# Patient Record
Sex: Male | Born: 1965
Health system: Southern US, Community
[De-identification: ages and names within clinical notes are randomized; demographics above are authoritative.]

## PROBLEM LIST (undated history)

## (undated) DIAGNOSIS — E785 Hyperlipidemia, unspecified: Secondary | ICD-10-CM

## (undated) DIAGNOSIS — I10 Essential (primary) hypertension: Secondary | ICD-10-CM

## (undated) DIAGNOSIS — E119 Type 2 diabetes mellitus without complications: Secondary | ICD-10-CM

## (undated) DIAGNOSIS — R011 Cardiac murmur, unspecified: Secondary | ICD-10-CM

## (undated) HISTORY — DX: Type 2 diabetes mellitus without complications: E11.9

## (undated) HISTORY — DX: Hyperlipidemia, unspecified: E78.5

## (undated) HISTORY — DX: Essential (primary) hypertension: I10

## (undated) HISTORY — DX: Cardiac murmur, unspecified: R01.1

---

## 1991-03-16 HISTORY — PX: PELVIC FRACTURE SURGERY: SHX119

## 2018-12-01 DIAGNOSIS — Z1159 Encounter for screening for other viral diseases: Secondary | ICD-10-CM | POA: Diagnosis not present

## 2019-09-25 DIAGNOSIS — K115 Sialolithiasis: Secondary | ICD-10-CM | POA: Diagnosis not present

## 2019-09-27 DIAGNOSIS — K0889 Other specified disorders of teeth and supporting structures: Secondary | ICD-10-CM | POA: Diagnosis not present

## 2019-09-27 DIAGNOSIS — K115 Sialolithiasis: Secondary | ICD-10-CM | POA: Diagnosis not present

## 2019-10-29 ENCOUNTER — Ambulatory Visit: Payer: Federal, State, Local not specified - PPO | Admitting: Physician Assistant

## 2019-11-12 DIAGNOSIS — K080 Exfoliation of teeth due to systemic causes: Secondary | ICD-10-CM | POA: Diagnosis not present

## 2019-11-12 HISTORY — PX: DENTAL SURGERY: SHX609

## 2019-11-28 ENCOUNTER — Other Ambulatory Visit: Payer: Self-pay

## 2019-11-28 ENCOUNTER — Ambulatory Visit (INDEPENDENT_AMBULATORY_CARE_PROVIDER_SITE_OTHER): Payer: Federal, State, Local not specified - PPO | Admitting: Physician Assistant

## 2019-11-28 ENCOUNTER — Encounter: Payer: Self-pay | Admitting: Physician Assistant

## 2019-11-28 VITALS — BP 120/80 | HR 84 | Temp 98.0°F | Ht 68.0 in | Wt 221.0 lb

## 2019-11-28 DIAGNOSIS — E119 Type 2 diabetes mellitus without complications: Secondary | ICD-10-CM | POA: Insufficient documentation

## 2019-11-28 DIAGNOSIS — G4739 Other sleep apnea: Secondary | ICD-10-CM | POA: Diagnosis not present

## 2019-11-28 DIAGNOSIS — E785 Hyperlipidemia, unspecified: Secondary | ICD-10-CM

## 2019-11-28 DIAGNOSIS — B351 Tinea unguium: Secondary | ICD-10-CM | POA: Insufficient documentation

## 2019-11-28 DIAGNOSIS — E1169 Type 2 diabetes mellitus with other specified complication: Secondary | ICD-10-CM

## 2019-11-28 DIAGNOSIS — N529 Male erectile dysfunction, unspecified: Secondary | ICD-10-CM | POA: Insufficient documentation

## 2019-11-28 DIAGNOSIS — I10 Essential (primary) hypertension: Secondary | ICD-10-CM

## 2019-11-28 DIAGNOSIS — E7849 Other hyperlipidemia: Secondary | ICD-10-CM | POA: Diagnosis not present

## 2019-11-28 MED ORDER — TADALAFIL 20 MG PO TABS: 10.0000 mg | ORAL_TABLET | ORAL | 11 refills | Status: AC | PRN

## 2019-11-28 NOTE — Assessment & Plan Note (Signed)
Has never been on medications

## 2019-11-28 NOTE — Progress Notes (Signed)
Harold Grimes is a 54 y.o. male is here to discuss:  I acted as a Neurosurgeon for [employer], PA-C Corky Mull, LPN   History of Present Illness:   Chief Complaint  Patient presents with  . Establish Care  . Diabetes  . Nail Problem  . Erectile Dysfunction    HPI   Diabetes Diagnosed in 2019. Last HgbA1c 7.2, highest was 7.7. Pt is checking sugars BID, averaging 176, this morning was 161. Currently taking Metformin 500 mg BID. Complaint with medication. Denies signs of hypoglycemia. Has never been on medication.  HTN Currently not on any medication. At home blood pressure readings are: not checked. Patient denies chest pain, SOB, blurred vision, dizziness, unusual headaches, lower leg swelling.  Denies excessive caffeine intake, stimulant usage, excessive alcohol intake, or increase in salt consumption.  BP Readings from Last 3 Encounters:  11/28/19 120/80   Sleep-apnea like behavior Has concerns that he may have sleep apnea. Was supposed to have sleep study in the past but did not due to life responsibilities. Wife is present and states that he has significant snoring and pauses in breathing.  Toenail fungus Pt would like to discuss treatment for fungus.  Has never tried anything in the past. Has significantly thickened toenails that are difficult to cut.  Erectile dysfunction Pt c/o problems x 1 year. Has never tried prescription medications. States that things are "just not working."   Health Maintenance Due  Topic Date Due  . HEMOGLOBIN A1C  Never done  . Hepatitis C Screening  Never done  . OPHTHALMOLOGY EXAM  Never done  . URINE MICROALBUMIN  Never done  . HIV Screening  Never done  . COLONOSCOPY  Never done  . INFLUENZA VACCINE  10/14/2019    Past Medical History:  Diagnosis Date  . Diabetes mellitus without complication (HCC)   . Heart murmur   . Hyperlipidemia   . Hypertension      Social History   Tobacco Use  . Smoking status: Former  Smoker    Types: Cigarettes  . Smokeless tobacco: Never Used  . Tobacco comment: quit 2007  Vaping Use  . Vaping Use: Never used  Substance Use Topics  . Alcohol use: Yes    Alcohol/week: 2.0 - 3.0 standard drinks    Types: 2 - 3 Cans of beer per week  . Drug use: Never    Past Surgical History:  Procedure Laterality Date  . DENTAL SURGERY  11/12/2019   Laser sx on gums  . PELVIC FRACTURE SURGERY  1993    History reviewed. No pertinent family history.  PMHx, SurgHx, SocialHx, FamHx, Medications, and Allergies were reviewed in the Visit Navigator and updated as appropriate.   Patient Active Problem List   Diagnosis Date Noted  . Diabetes mellitus (HCC) 11/28/2019  . Essential hypertension 11/28/2019  . Hyperlipidemia associated with type 2 diabetes mellitus (HCC) 11/28/2019  . Sleep apnea-like behavior 11/28/2019  . Toenail fungus 11/28/2019  . Erectile dysfunction 11/28/2019    Social History   Tobacco Use  . Smoking status: Former Smoker    Types: Cigarettes  . Smokeless tobacco: Never Used  . Tobacco comment: quit 2007  Vaping Use  . Vaping Use: Never used  Substance Use Topics  . Alcohol use: Yes    Alcohol/week: 2.0 - 3.0 standard drinks    Types: 2 - 3 Cans of beer per week  . Drug use: Never    Current Medications and Allergies:  Current Outpatient Medications:  .  aspirin 81 MG chewable tablet, Chew by mouth., Disp: , Rfl:  .  atorvastatin (LIPITOR) 40 MG tablet, Take 40 mg by mouth at bedtime., Disp: , Rfl:  .  ibuprofen (ADVIL) 200 MG tablet, Take by mouth., Disp: , Rfl:  .  metFORMIN (GLUCOPHAGE) 500 MG tablet, Take 500 mg by mouth 2 (two) times daily., Disp: , Rfl:  .  tadalafil (CIALIS) 20 MG tablet, Take 0.5-1 tablets (10-20 mg total) by mouth every other day as needed for erectile dysfunction., Disp: 5 tablet, Rfl: 11  No Known Allergies  Review of Systems   ROS  Negative unless otherwise specified per HPI.  Vitals:   Vitals:    11/28/19 0957  BP: 120/80  Pulse: 84  Temp: 98 F (36.7 C)  TempSrc: Temporal  SpO2: 95%  Weight: 221 lb (100.2 kg)  Height: 5\' 8"  (1.727 m)     Body mass index is 33.6 kg/m.   Physical Exam:    Physical Exam Vitals and nursing note reviewed.  Constitutional:      General: He is not in acute distress.    Appearance: He is well-developed. He is not ill-appearing or toxic-appearing.  Cardiovascular:     Rate and Rhythm: Normal rate and regular rhythm.     Pulses: Normal pulses.     Heart sounds: Normal heart sounds, S1 normal and S2 normal.     Comments: No LE edema Pulmonary:     Effort: Pulmonary effort is normal.     Breath sounds: Normal breath sounds.  Feet:     Comments: Significant yellow and thickened toenails on bilateral feet Skin:    General: Skin is warm and dry.  Neurological:     Mental Status: He is alert.     GCS: GCS eye subscore is 4. GCS verbal subscore is 5. GCS motor subscore is 6.  Psychiatric:        Speech: Speech normal.        Behavior: Behavior normal. Behavior is cooperative.    Diabetic Foot Exam - Simple   Simple Foot Form Diabetic Foot exam was performed with the following findings: Yes 11/28/2019 11:07 AM  Visual Inspection No deformities, no ulcerations, no other skin breakdown bilaterally: Yes Sensation Testing Intact to touch and monofilament testing bilaterally: Yes Pulse Check Posterior Tibialis and Dorsalis pulse intact bilaterally: Yes Comments       Assessment and Plan:    Shloime was seen today for establish care, diabetes, nail problem and erectile dysfunction.  Diagnoses and all orders for this visit:  Type 2 diabetes mellitus without complication, without long-term current use of insulin (HCC) Update HgbA1c today. Possible increase Metformin dosage, however I did discuss GLP-1, would like to trial Ozempic if patient is agreeable. Foot exam completed today. Eye exam needed - referral placed. Follow-up in 3  months. -     CBC with Differential/Platelet; Future -     Comprehensive metabolic panel; Future -     Hemoglobin A1c; Future -     Cancel: Microalbumin / creatinine urine ratio; Future -     Microalbumin / creatinine urine ratio; Future -     Microalbumin / creatinine urine ratio -     Hemoglobin A1c -     Comprehensive metabolic panel -     CBC with Differential/Platelet -     Ambulatory referral to Ophthalmology  Essential hypertension Currently well controlled. Recommend sleep study evaluation. Follow-up in 3 months.  Erectile  dysfunction, unspecified erectile dysfunction type Trial Cialis. Follow-up as needed.  Toenail fungus Consider Terbinafine if LFTs allowed, otherwise will refer to podiatry.  Sleep apnea-like behavior Referral to sleep studies. -     Ambulatory referral to Sleep Studies  Hyperlipidemia associated with type 2 diabetes mellitus (HCC) Update lipid panel and reassess ASCVD. Will adjust lipitor as needed. -     Lipid panel; Future -     Lipid panel  Other orders -     tadalafil (CIALIS) 20 MG tablet; Take 0.5-1 tablets (10-20 mg total) by mouth every other day as needed for erectile dysfunction.  . Reviewed expectations re: course of current medical issues. . Discussed self-management of symptoms. . Outlined signs and symptoms indicating need for more acute intervention. . Patient verbalized understanding and all questions were answered. . See orders for this visit as documented in the electronic medical record. . Patient received an After Visit Summary.  Time spent with patient today was 45 minutes which consisted of chart review, discussing diagnosis, work up, treatment answering questions and documentation.  CMA or LPN served as scribe during this visit. History, Physical, and Plan performed by medical provider. The above documentation has been reviewed and is accurate and complete.   Jarold Motto, PA-C [place] 11/28/2019  Follow-up: No follow-ups on file.

## 2019-11-28 NOTE — Patient Instructions (Signed)
It was great to see you!  Diabetes: Let's update your HgbA1c today Consider weekly Ozempic injections! We did your foot exam today You will be contacted about appointment for eye doctor I will be in touch with the recommendations  Toenail fungus: Let's check your liver labs; may start oral medication if your liver tests allow Or we will send to podiatry  ED Cialis rx has been printed Check Good[facility] to see where its the cheapest  Sleep apnea You will be contacted about a referral for this  Follow-up with me in 3 months, sooner if concerns.  Take care,  Jarold Motto PA-C

## 2019-11-29 ENCOUNTER — Encounter: Payer: Self-pay | Admitting: Physician Assistant

## 2019-11-29 LAB — LIPID PANEL
Cholesterol: 143 mg/dL (ref <200)
HDL: 47 mg/dL (ref >=40)
LDL Cholesterol (Calc): 71 mg/dL (calc)
Non-HDL Cholesterol (Calc): 96 mg/dL (calc) (ref <130)
Total CHOL/HDL Ratio: 3 (calc) (ref <5.0)
Triglycerides: 176 mg/dL — ABNORMAL HIGH (ref <150)

## 2019-11-29 LAB — COMPREHENSIVE METABOLIC PANEL
AG Ratio: 1.7 (calc) (ref 1.0–2.5)
ALT: 37 U/L (ref 9–46)
AST: 22 U/L (ref 10–35)
Albumin: 4.6 g/dL (ref 3.6–5.1)
Alkaline phosphatase (APISO): 76 U/L (ref 35–144)
BUN: 14 mg/dL (ref 7–25)
CO2: 29 mmol/L (ref 20–32)
Calcium: 9.7 mg/dL (ref 8.6–10.3)
Chloride: 102 mmol/L (ref 98–110)
Creat: 0.96 mg/dL (ref 0.70–1.33)
Globulin: 2.7 g/dL (calc) (ref 1.9–3.7)
Glucose, Bld: 119 mg/dL — ABNORMAL HIGH (ref 65–99)
Potassium: 4.9 mmol/L (ref 3.5–5.3)
Sodium: 139 mmol/L (ref 135–146)
Total Bilirubin: 0.6 mg/dL (ref 0.2–1.2)
Total Protein: 7.3 g/dL (ref 6.1–8.1)

## 2019-11-29 LAB — CBC WITH DIFFERENTIAL/PLATELET
Absolute Monocytes: 599 cells/uL (ref 200–950)
Basophils Absolute: 80 cells/uL (ref 0–200)
Basophils Relative: 1.1 %
Eosinophils Absolute: 102 cells/uL (ref 15–500)
Eosinophils Relative: 1.4 %
HCT: 46.2 % (ref 38.5–50.0)
Hemoglobin: 15.4 g/dL (ref 13.2–17.1)
Lymphs Abs: 2117 cells/uL (ref 850–3900)
MCH: 29.1 pg (ref 27.0–33.0)
MCHC: 33.3 g/dL (ref 32.0–36.0)
MCV: 87.3 fL (ref 80.0–100.0)
MPV: 10.2 fL (ref 7.5–12.5)
Monocytes Relative: 8.2 %
Neutro Abs: 4402 cells/uL (ref 1500–7800)
Neutrophils Relative %: 60.3 %
Platelets: 328 10*3/uL (ref 140–400)
RBC: 5.29 10*6/uL (ref 4.20–5.80)
RDW: 12.6 % (ref 11.0–15.0)
Total Lymphocyte: 29 %
WBC: 7.3 10*3/uL (ref 3.8–10.8)

## 2019-11-29 LAB — MICROALBUMIN / CREATININE URINE RATIO
Creatinine, Urine: 97 mg/dL (ref 20–320)
Microalb Creat Ratio: 7 mcg/mg creat (ref <30)
Microalb, Ur: 0.7 mg/dL

## 2019-11-29 LAB — HEMOGLOBIN A1C
Hgb A1c MFr Bld: 6.7 % of total Hgb — ABNORMAL HIGH (ref <5.7)
Mean Plasma Glucose: 146 (calc)
eAG (mmol/L): 8.1 (calc)

## 2019-11-30 NOTE — Telephone Encounter (Signed)
Spoke to told him per Lelon Mast, we got his message and tell him he can pick up an ozempic sample and Start weekly 0.25 mg injections. Pt verbalized understanding and will come by and pickup sample. Also Follow-up with me in 1 month to see how things are going, sooner if concerns. Pt verbalized understanding.

## 2019-12-10 ENCOUNTER — Telehealth: Payer: Self-pay | Admitting: Neurology

## 2019-12-10 NOTE — Telephone Encounter (Signed)
I called pt to reschedule sleep consult. No answer, left message for pt to call me back to reschedule sleep consult with Dr. Athar.  °

## 2019-12-18 ENCOUNTER — Encounter: Payer: Self-pay | Admitting: Physician Assistant

## 2019-12-21 ENCOUNTER — Other Ambulatory Visit: Payer: Self-pay | Admitting: Physician Assistant

## 2019-12-21 MED ORDER — TERBINAFINE HCL 250 MG PO TABS: 250.0000 mg | ORAL_TABLET | Freq: Every day | ORAL | 0 refills | Status: AC

## 2019-12-24 DIAGNOSIS — K080 Exfoliation of teeth due to systemic causes: Secondary | ICD-10-CM | POA: Diagnosis not present

## 2020-01-03 DIAGNOSIS — H524 Presbyopia: Secondary | ICD-10-CM | POA: Diagnosis not present

## 2020-01-03 DIAGNOSIS — H25043 Posterior subcapsular polar age-related cataract, bilateral: Secondary | ICD-10-CM | POA: Diagnosis not present

## 2020-01-03 DIAGNOSIS — H40013 Open angle with borderline findings, low risk, bilateral: Secondary | ICD-10-CM | POA: Diagnosis not present

## 2020-01-03 DIAGNOSIS — H5213 Myopia, bilateral: Secondary | ICD-10-CM | POA: Diagnosis not present

## 2020-01-03 DIAGNOSIS — H52223 Regular astigmatism, bilateral: Secondary | ICD-10-CM | POA: Diagnosis not present

## 2020-01-03 DIAGNOSIS — E119 Type 2 diabetes mellitus without complications: Secondary | ICD-10-CM | POA: Diagnosis not present

## 2020-01-03 DIAGNOSIS — H0102A Squamous blepharitis right eye, upper and lower eyelids: Secondary | ICD-10-CM | POA: Diagnosis not present

## 2020-01-03 LAB — HM DIABETES EYE EXAM

## 2020-01-04 ENCOUNTER — Encounter: Payer: Self-pay | Admitting: Physician Assistant

## 2020-01-04 ENCOUNTER — Other Ambulatory Visit: Payer: Self-pay

## 2020-01-04 ENCOUNTER — Ambulatory Visit: Payer: Federal, State, Local not specified - PPO | Admitting: Physician Assistant

## 2020-01-04 VITALS — BP 120/80 | HR 76 | Temp 98.3°F | Ht 68.0 in | Wt 218.0 lb

## 2020-01-04 DIAGNOSIS — E119 Type 2 diabetes mellitus without complications: Secondary | ICD-10-CM | POA: Diagnosis not present

## 2020-01-04 DIAGNOSIS — Z23 Encounter for immunization: Secondary | ICD-10-CM | POA: Diagnosis not present

## 2020-01-04 MED ORDER — OZEMPIC (0.25 OR 0.5 MG/DOSE) 2 MG/1.5ML ~~LOC~~ SOPN: 0.5000 mg | PEN_INJECTOR | SUBCUTANEOUS | 3 refills | Status: DC

## 2020-01-04 NOTE — Patient Instructions (Signed)
It was great to see you!  I'm glad you are doing well!  Let's have you follow-up around Christmas/New Years to update your HgbA1c, sooner if concerns.  Take care,  Jarold Motto PA-C

## 2020-01-04 NOTE — Progress Notes (Signed)
Harold Grimes is a 54 y.o. male is here for follow up.  I acted as a Neurosurgeon for [employer], PA-C Harold Mull, LPN   History of Present Illness:   Chief Complaint  Patient presents with  . Medication Management    Ozempic    HPI   Medication management Pt is here today to follow up on Ozempic was started last month. He discontinued his metformin. Pt said he is tolerating Ozempic 0.25 mg weekly, no side effects. Denies nausea or diarrhea. He is down 3 lbs since last visit 9/15. He had anxiety about self-injections but is doing quite well. Had his eye exam yesterday.  Wt Readings from Last 5 Encounters:  01/04/20 218 lb (98.9 kg)  11/28/19 221 lb (100.2 kg)     Health Maintenance Due  Topic Date Due  . Hepatitis C Screening  Never done  . OPHTHALMOLOGY EXAM  Never done  . HIV Screening  Never done  . COLONOSCOPY  Never done  . INFLUENZA VACCINE  10/14/2019    Past Medical History:  Diagnosis Date  . Diabetes mellitus without complication (HCC)   . Heart murmur   . Hyperlipidemia   . Hypertension      Social History   Tobacco Use  . Smoking status: Former Smoker    Types: Cigarettes  . Smokeless tobacco: Never Used  . Tobacco comment: quit 2007  Vaping Use  . Vaping Use: Never used  Substance Use Topics  . Alcohol use: Yes    Alcohol/week: 2.0 - 3.0 standard drinks    Types: 2 - 3 Cans of beer per week  . Drug use: Never    Past Surgical History:  Procedure Laterality Date  . DENTAL SURGERY  11/12/2019   Laser sx on gums  . PELVIC FRACTURE SURGERY  1993    Family History  Problem Relation Age of Onset  . Alcohol abuse Father   . Cancer Father   . Heart attack Father   . Drug abuse Sister   . Hearing loss Brother   . Heart disease Brother   . Cancer Paternal Grandmother   . Cancer Paternal Grandfather   . Heart attack Paternal Grandfather   . Drug abuse Brother     PMHx, SurgHx, SocialHx, FamHx, Medications, and Allergies were  reviewed in the Visit Navigator and updated as appropriate.   Patient Active Problem List   Diagnosis Date Noted  . Diabetes mellitus (HCC) 11/28/2019  . Essential hypertension 11/28/2019  . Hyperlipidemia associated with type 2 diabetes mellitus (HCC) 11/28/2019  . Sleep apnea-like behavior 11/28/2019  . Toenail fungus 11/28/2019  . Erectile dysfunction 11/28/2019    Social History   Tobacco Use  . Smoking status: Former Smoker    Types: Cigarettes  . Smokeless tobacco: Never Used  . Tobacco comment: quit 2007  Vaping Use  . Vaping Use: Never used  Substance Use Topics  . Alcohol use: Yes    Alcohol/week: 2.0 - 3.0 standard drinks    Types: 2 - 3 Cans of beer per week  . Drug use: Never    Current Medications and Allergies:    Current Outpatient Medications:  .  aspirin 81 MG chewable tablet, Chew by mouth., Disp: , Rfl:  .  atorvastatin (LIPITOR) 40 MG tablet, Take 40 mg by mouth at bedtime., Disp: , Rfl:  .  ibuprofen (ADVIL) 200 MG tablet, Take by mouth., Disp: , Rfl:  .  tadalafil (CIALIS) 20 MG tablet, Take 0.5-1 tablets (  10-20 mg total) by mouth every other day as needed for erectile dysfunction., Disp: 5 tablet, Rfl: 11 .  terbinafine (LAMISIL) 250 MG tablet, Take 1 tablet (250 mg total) by mouth daily., Disp: 42 tablet, Rfl: 0 .  Semaglutide,0.25 or 0.5MG /DOS, (OZEMPIC, 0.25 OR 0.5 MG/DOSE,) 2 MG/1.5ML SOPN, Inject 0.5 mg into the skin once a week., Disp: 1.5 mL, Rfl: 3  No Known Allergies  Review of Systems   ROS Negative unless otherwise specified per HPI.  Vitals:   Vitals:   01/04/20 1048  BP: 120/80  Pulse: 76  Temp: 98.3 F (36.8 C)  TempSrc: Temporal  SpO2: 94%  Weight: 218 lb (98.9 kg)  Height: 5\' 8"  (1.727 m)     Body mass index is 33.15 kg/m.   Physical Exam:    Physical Exam Vitals and nursing note reviewed.  Constitutional:      Appearance: He is well-developed.  HENT:     Head: Normocephalic.  Eyes:     Conjunctiva/sclera:  Conjunctivae normal.     Pupils: Pupils are equal, round, and reactive to light.  Pulmonary:     Effort: Pulmonary effort is normal.  Musculoskeletal:        General: Normal range of motion.     Cervical back: Normal range of motion.  Skin:    General: Skin is warm and dry.  Neurological:     Mental Status: He is alert and oriented to person, place, and time.  Psychiatric:        Behavior: Behavior normal.        Thought Content: Thought content normal.        Judgment: Judgment normal.      Assessment and Plan:    Harold Grimes was seen today for medication management.  Diagnoses and all orders for this visit:  Type 2 diabetes mellitus without complication, without long-term current use of insulin (HCC)  Other orders -     Semaglutide,0.25 or 0.5MG /DOS, (OZEMPIC, 0.25 OR 0.5 MG/DOSE,) 2 MG/1.5ML SOPN; Inject 0.5 mg into the skin once a week.    Doing quite well with Ozempic 0.25 mg weekly. Will increase to Ozempic 0.5 mg weekly. Savings card provided. Follow-up around Christmas/New Years, sooner if concerns.  CMA or LPN served as scribe during this visit. History, Physical, and Plan performed by medical provider. The above documentation has been reviewed and is accurate and complete.  Harold Maduro, PA-C , [facility] 01/04/2020  Follow-up: No follow-ups on file.

## 2020-01-06 ENCOUNTER — Encounter: Payer: Self-pay | Admitting: Physician Assistant

## 2020-01-07 ENCOUNTER — Institutional Professional Consult (permissible substitution): Payer: Self-pay | Admitting: Neurology

## 2020-01-10 ENCOUNTER — Ambulatory Visit: Payer: Federal, State, Local not specified - PPO | Admitting: Neurology

## 2020-01-10 ENCOUNTER — Encounter: Payer: Self-pay | Admitting: Neurology

## 2020-01-10 VITALS — BP 126/84 | HR 85 | Ht 69.0 in | Wt 212.0 lb

## 2020-01-10 DIAGNOSIS — F410 Panic disorder [episodic paroxysmal anxiety] without agoraphobia: Secondary | ICD-10-CM

## 2020-01-10 DIAGNOSIS — G4719 Other hypersomnia: Secondary | ICD-10-CM

## 2020-01-10 DIAGNOSIS — G4730 Sleep apnea, unspecified: Secondary | ICD-10-CM

## 2020-01-10 DIAGNOSIS — F4310 Post-traumatic stress disorder, unspecified: Secondary | ICD-10-CM

## 2020-01-10 DIAGNOSIS — R29818 Other symptoms and signs involving the nervous system: Secondary | ICD-10-CM | POA: Insufficient documentation

## 2020-01-10 DIAGNOSIS — G4709 Other insomnia: Secondary | ICD-10-CM

## 2020-01-10 DIAGNOSIS — R0683 Snoring: Secondary | ICD-10-CM

## 2020-01-10 DIAGNOSIS — G4710 Hypersomnia, unspecified: Secondary | ICD-10-CM
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
## 2020-01-10 NOTE — Patient Instructions (Signed)
Sleep Apnea Sleep apnea affects breathing during sleep. It causes breathing to stop for a short time or to become shallow. It can also increase the risk of:  Heart attack.  Stroke.  Being very overweight (obese).  Diabetes.  Heart failure.  Irregular heartbeat. The goal of treatment is to help you breathe normally again. What are the causes? There are three kinds of sleep apnea:  Obstructive sleep apnea. This is caused by a blocked or collapsed airway.  Central sleep apnea. This happens when the brain does not send the right signals to the muscles that control breathing.  Mixed sleep apnea. This is a combination of obstructive and central sleep apnea. The most common cause of this condition is a collapsed or blocked airway. This can happen if:  Your throat muscles are too relaxed.  Your tongue and tonsils are too large.  You are overweight.  Your airway is too small. What increases the risk?  Being overweight.  Smoking.  Having a small airway.  Being older.  Being male.  Drinking alcohol.  Taking medicines to calm yourself (sedatives or tranquilizers).  Having family members with the condition. What are the signs or symptoms?  Trouble staying asleep.  Being sleepy or tired during the day.  Getting angry a lot.  Loud snoring.  Headaches in the morning.  Not being able to focus your mind (concentrate).  Forgetting things.  Less interest in sex.  Mood swings.  Personality changes.  Feelings of sadness (depression).  Waking up a lot during the night to pee (urinate).  Dry mouth.  Sore throat. How is this diagnosed?  Your medical history.  A physical exam.  A test that is done when you are sleeping (sleep study). The test is most often done in a sleep lab but may also be done at home. How is this treated?   Sleeping on your side.  Using a medicine to get rid of mucus in your nose (decongestant).  Avoiding the use of alcohol,  medicines to help you relax, or certain pain medicines (narcotics).  Losing weight, if needed.  Changing your diet.  Not smoking.  Using a machine to open your airway while you sleep, such as: ? An oral appliance. This is a mouthpiece that shifts your lower jaw forward. ? A CPAP device. This device blows air through a mask when you breathe out (exhale). ? An EPAP device. This has valves that you put in each nostril. ? A BPAP device. This device blows air through a mask when you breathe in (inhale) and breathe out.  Having surgery if other treatments do not work. It is important to get treatment for sleep apnea. Without treatment, it can lead to:  High blood pressure.  Coronary artery disease.  In men, not being able to have an erection (impotence).  Reduced thinking ability. Follow these instructions at home: Lifestyle  Make changes that your doctor recommends.  Eat a healthy diet.  Lose weight if needed.  Avoid alcohol, medicines to help you relax, and some pain medicines.  Do not use any products that contain nicotine or tobacco, such as cigarettes, e-cigarettes, and chewing tobacco. If you need help quitting, ask your doctor. General instructions  Take over-the-counter and prescription medicines only as told by your doctor.  If you were given a machine to use while you sleep, use it only as told by your doctor.  If you are having surgery, make sure to tell your doctor you have sleep apnea. You   may need to bring your device with you.  Keep all follow-up visits as told by your doctor. This is important. Contact a doctor if:  The machine that you were given to use during sleep bothers you or does not seem to be working.  You do not get better.  You get worse. Get help right away if:  Your chest hurts.  You have trouble breathing in enough air.  You have an uncomfortable feeling in your back, arms, or stomach.  You have trouble talking.  One side of your  body feels weak.  A part of your face is hanging down. These symptoms may be an emergency. Do not wait to see if the symptoms will go away. Get medical help right away. Call your local emergency services (911 in the [place] Do not drive yourself to the hospital. Summary  This condition affects breathing during sleep.  The most common cause is a collapsed or blocked airway.  The goal of treatment is to help you breathe normally while you sleep. This information is not intended to replace advice given to you by your health care provider. Make sure you discuss any questions you have with your health care provider. Document Revised: 12/16/2017 Document Reviewed: 10/25/2017 Elsevier Patient Education  2020 [employer] Managing Post-Traumatic Stress Disorder If you have been diagnosed with post-traumatic stress disorder (PTSD), you may be relieved that you now know why you have felt or behaved a certain way. Still, you may feel overwhelmed about the treatment ahead. You may also wonder how to get the support you need and how to deal with the condition day-to-day. If you are living with PTSD, there are ways to help you recover from it and manage your symptoms. How to manage lifestyle changes Managing stress Stress is your body's reaction to life changes and events, both good and bad. Stress can make PTSD worse. Take the following steps to manage stress:  Talk with your health care provider or a counselor if you would like to learn more about techniques to reduce your stress. He or she may suggest some stress reduction techniques such as: ? Muscle relaxation exercises. ? Regular exercise. ? Meditation, yoga, or other mind-body exercises. ? Breathing exercises. ? Listening to quiet music. ? Spending time outside.  Maintain a healthy lifestyle. Eat a healthy diet, exercise regularly, get plenty of sleep, and take time to relax.  Spend time with others. Talk with them about how you are feeling  and what kind of support you need. Try not to isolate yourself, even though you may feel like doing that. Isolating yourself can delay your recovery.  Do activities and hobbies that you enjoy.  Pace yourself when doing stressful things. Take breaks, and reward yourself when you finish. Make sure that you do not overload your schedule.  Medicines Your health care provider may suggest certain medicines if he or she feels that they will help to improve your condition. Medicines for depression (antidepressants) or severe loss of contact with reality (antipsychotics) may be used to treat PTSD. Avoid using alcohol and other substances that may prevent your medicines from working properly. It is also important to:  Talk with your pharmacist or health care provider about all medicines that you take, their possible side effects, and which medicines are safe to take together.  Make it your goal to take part in all treatment decisions (shared decision-making). Ask about possible side effects of medicines that your health care provider recommends, and tell him  or her how you feel about having those side effects. It is best if shared decision-making with your health care provider is part of your total treatment plan. If your health care provider prescribes a medicine, you may not notice the full benefits of it for 4-8 weeks. Most people who are treated for PTSD need to take medicine for at least 6-12 months after they feel better. If you are taking medicines as part of your treatment, do not stop taking medicines before you ask your health care provider if it is safe to stop. You may need to have the medicine slowly decreased (tapered) over time to lower the risk of harmful side effects. Relationships Many people who have PTSD have difficulty trusting others. Make an effort to:  Take risks and develop trust with close friends and family members. Developing trust in others can help you feel safe and connect you  with emotional support.  Be open and honest about your feelings.  Have fun and relax in safe spaces, such as with friends and family.  Think about going to couples counseling, family education classes, or family therapy. Your loved ones may not always know how to be supportive. Therapy can be helpful for everyone. How to recognize changes in your condition Be aware of your symptoms and how often you have them. The following symptoms mean that you need to seek help for your PTSD:  You feel suspicious and angry.  You have repeated flashbacks.  You avoid going out or being with others.  You have an increasing number of fights with close friends or family members, such as your spouse.  You have thoughts about hurting yourself or others.  You cannot get relief from feelings of depression or anxiety. Follow these instructions at home: Lifestyle  Exercise regularly. Try to do 30 or more minutes of physical activity on most days of the week.  Try to get 7-9 hours of sleep each night. To help with sleep: ? Keep your bedroom cool and dark. ? Avoid screen time before bedtime. This means avoiding use of your TV, computer, tablet, and cell phone.  Practice self-soothing skills and use them daily.  Try to have fun and seek humor in your life. Eating and drinking  Do not eat a heavy meal during the hour before you go to bed.  Do not drink alcohol or caffeinated drinks before bed.  Avoid using alcohol or drugs. General instructions  If your PTSD is affecting your marriage or family, seek help from a family therapist.  Take over-the-counter and prescription medicines only as told by your health care provider.  Make sure to let all of your health care providers know that you have PTSD. This is especially important if you are having surgery or need to be admitted to the hospital.  Keep all follow-up visits as told by your health care providers. This is important. Where to find  support Talking to others  Explain that PTSD is a mental health problem. It is something that a person can develop after experiencing or seeing a life-threatening event. Tell them that PTSD makes you feel stress like you did during the event.  Talk to your loved ones about the symptoms you have. Also tell them what things or situations can cause symptoms to start (are triggers for you).  Assure your loved ones that there are treatments to help PTSD. Discuss possibly seeking family therapy or couples therapy.  If you are worried or fearful about seeking treatment, ask  for support.  Keep daily contact with at least one trusted friend or family member. Finances Not all insurance plans cover mental health care, so it is important to check with your insurance carrier. If paying for co-pays or counseling services is a problem, search for a local or county mental health care center. Public mental health care services may be offered there at a low cost or no cost when you are not able to see a private health care provider. If you are a veteran, contact a local veterans organization or veterans hospital for more information. If you are taking medicine for PTSD, you may be able to get the genericform, which may be less expensive than brand-name medicine. Some makers of prescription medicines also offer help to patients who cannot afford the medicines that they need. Therapy and support groups  Find a support group in your community. Often, groups are available for [employer] veterans, trauma victims, and family members or caregivers.  Look into volunteer opportunities. Taking part in these can help you feel more connected to your community.  Contact a local organization to find out if you are eligible for a service dog. Where to find more information Go to this website to find more information about PTSD, treatment of PTSD, and how to get support:  [facility] for PTSD: www.ptsd.FitBoxer.tn Contact a  health care provider if:  Your symptoms get worse or do not get better. Get help right away if:  You have thoughts about hurting yourself or others. If you ever feel like you may hurt yourself or others, or have thoughts about taking your own life, get help right away. You can go to your nearest emergency department or call:  Your local emergency services (911 in the [place]  A suicide crisis helpline, such as the National Suicide Prevention Lifeline at (913) 067-5731. This is open 24-hours a day. Summary  If you are living with PTSD, there are ways to help you recover from it and manage your symptoms.  Find supportive environments and people who understand PTSD. Spend time in those places, and maintain contact with those people.  Work with your health care team to create a plan for managing PTSD. The plan should include counseling, stress reduction techniques, and healthy lifestyle habits. This information is not intended to replace advice given to you by your health care provider. Make sure you discuss any questions you have with your health care provider. Document Revised: 06/23/2018 Document Reviewed: 07/01/2016 Elsevier Patient Education  2020 [employer] Inc. Insomnia Insomnia is a sleep disorder that makes it difficult to fall asleep or stay asleep. Insomnia can cause fatigue, low energy, difficulty concentrating, mood swings, and poor performance at work or school. There are three different ways to classify insomnia:  Difficulty falling asleep.  Difficulty staying asleep.  Waking up too early in the morning. Any type of insomnia can be long-term (chronic) or short-term (acute). Both are common. Short-term insomnia usually lasts for three months or less. Chronic insomnia occurs at least three times a week for longer than three months. What are the causes? Insomnia may be caused by another condition, situation, or substance, such as:  Anxiety.  Certain  medicines.  Gastroesophageal reflux disease (GERD) or other gastrointestinal conditions.  Asthma or other breathing conditions.  Restless legs syndrome, sleep apnea, or other sleep disorders.  Chronic pain.  Menopause.  Stroke.  Abuse of alcohol, tobacco, or illegal drugs.  Mental health conditions, such as depression.  Caffeine.  Neurological disorders, such as  Alzheimer's disease.  An overactive thyroid (hyperthyroidism). Sometimes, the cause of insomnia may not be known. What increases the risk? Risk factors for insomnia include:  Gender. Women are affected more often than men.  Age. Insomnia is more common as you get older.  Stress.  Lack of exercise.  Irregular work schedule or working night shifts.  Traveling between different time zones.  Certain medical and mental health conditions. What are the signs or symptoms? If you have insomnia, the main symptom is having trouble falling asleep or having trouble staying asleep. This may lead to other symptoms, such as:  Feeling fatigued or having low energy.  Feeling nervous about going to sleep.  Not feeling rested in the morning.  Having trouble concentrating.  Feeling irritable, anxious, or depressed. How is this diagnosed? This condition may be diagnosed based on:  Your symptoms and medical history. Your health care provider may ask about: ? Your sleep habits. ? Any medical conditions you have. ? Your mental health.  A physical exam. How is this treated? Treatment for insomnia depends on the cause. Treatment may focus on treating an underlying condition that is causing insomnia. Treatment may also include:  Medicines to help you sleep.  Counseling or therapy.  Lifestyle adjustments to help you sleep better. Follow these instructions at home: Eating and drinking   Limit or avoid alcohol, caffeinated beverages, and cigarettes, especially close to bedtime. These can disrupt your sleep.  Do not  eat a large meal or eat spicy foods right before bedtime. This can lead to digestive discomfort that can make it hard for you to sleep. Sleep habits   Keep a sleep diary to help you and your health care provider figure out what could be causing your insomnia. Write down: ? When you sleep. ? When you wake up during the night. ? How well you sleep. ? How rested you feel the next day. ? Any side effects of medicines you are taking. ? What you eat and drink.  Make your bedroom a dark, comfortable place where it is easy to fall asleep. ? Put up shades or blackout curtains to block light from outside. ? Use a white noise machine to block noise. ? Keep the temperature cool.  Limit screen use before bedtime. This includes: ? Watching TV. ? Using your smartphone, tablet, or computer.  Stick to a routine that includes going to bed and waking up at the same times every day and night. This can help you fall asleep faster. Consider making a quiet activity, such as reading, part of your nighttime routine.  Try to avoid taking naps during the day so that you sleep better at night.  Get out of bed if you are still awake after 15 minutes of trying to sleep. Keep the lights down, but try reading or doing a quiet activity. When you feel sleepy, go back to bed. General instructions  Take over-the-counter and prescription medicines only as told by your health care provider.  Exercise regularly, as told by your health care provider. Avoid exercise starting several hours before bedtime.  Use relaxation techniques to manage stress. Ask your health care provider to suggest some techniques that may work well for you. These may include: ? Breathing exercises. ? Routines to release muscle tension. ? Visualizing peaceful scenes.  Make sure that you drive carefully. Avoid driving if you feel very sleepy.  Keep all follow-up visits as told by your health care provider. This is important. Contact a health  care provider if:  You are tired throughout the day.  You have trouble in your daily routine due to sleepiness.  You continue to have sleep problems, or your sleep problems get worse. Get help right away if:  You have serious thoughts about hurting yourself or someone else. If you ever feel like you may hurt yourself or others, or have thoughts about taking your own life, get help right away. You can go to your nearest emergency department or call:  Your local emergency services (911 in the [place]  A suicide crisis helpline, such as the National Suicide Prevention Lifeline at 365-344-9747. This is open 24 hours a day. Summary  Insomnia is a sleep disorder that makes it difficult to fall asleep or stay asleep.  Insomnia can be long-term (chronic) or short-term (acute).  Treatment for insomnia depends on the cause. Treatment may focus on treating an underlying condition that is causing insomnia.  Keep a sleep diary to help you and your health care provider figure out what could be causing your insomnia. This information is not intended to replace advice given to you by your health care provider. Make sure you discuss any questions you have with your health care provider. Document Revised: 02/11/2017 Document Reviewed: 12/09/2016 Elsevier Patient Education  2020 [employer].

## 2020-01-28 ENCOUNTER — Telehealth: Payer: Self-pay

## 2020-01-28 NOTE — Telephone Encounter (Signed)
LVM for pt to call me back to schedule sleep study  

## 2020-02-06 ENCOUNTER — Other Ambulatory Visit: Payer: Self-pay

## 2020-02-06 ENCOUNTER — Ambulatory Visit (INDEPENDENT_AMBULATORY_CARE_PROVIDER_SITE_OTHER): Payer: Federal, State, Local not specified - PPO | Admitting: Neurology

## 2020-02-06 DIAGNOSIS — R29818 Other symptoms and signs involving the nervous system: Secondary | ICD-10-CM

## 2020-02-06 DIAGNOSIS — G4719 Other hypersomnia: Secondary | ICD-10-CM

## 2020-02-06 DIAGNOSIS — G4733 Obstructive sleep apnea (adult) (pediatric): Secondary | ICD-10-CM

## 2020-02-06 DIAGNOSIS — G4709 Other insomnia: Secondary | ICD-10-CM

## 2020-02-06 DIAGNOSIS — R0683 Snoring: Secondary | ICD-10-CM

## 2020-02-06 DIAGNOSIS — F4310 Post-traumatic stress disorder, unspecified: Secondary | ICD-10-CM

## 2020-02-16 NOTE — Addendum Note (Signed)
Addended by: Melvyn Novas on: 02/16/2020 05:56 PM   Modules accepted: Orders

## 2020-02-16 NOTE — Progress Notes (Signed)
[facility] may need a [facility]. IMPRESSION:   1. Severe Obstructive Sleep Apnea (OSA) at an AHI of 32.1/h with  prolonged oxygen desaturation (total time 39 min.) and to a nadir  of 82%. Apnea was worse in supine (AHI of 80.3/h) and in REM  sleep.  2. Loud Snoring was present in supine sleep and during sleep on  the right side.  3. A normal EKG in NSR was noted. EEG was also normal. There was  no parasomnia activity.    RECOMMENDATIONS:   1. Advise CPAP titration by auto CPAP device to improve this form  and degree of apnea.  2. The patient needs to avoid supine sleep- this alone reduces  apnea to a moderate degree. Sleeping on the left seemed the least  apnea prone position   3. REM dependent sleep apnea associated with oxygen desaturation  is not responsive to Inspire or Dental Device Therapy. I  recommend auto CPAP with a setting od 6-18 cm water pressure, 3  cm EPR and a mask of patient's choice that should be fitted while  in reclined position. Heated humidification and instruction that  any [employer] / adapt patient can return a mask within 30 days of  use if uncomfortable and mask will be exchanged at no costs.  4. Advise of supply chain delays affecting CPAP delivery date.

## 2020-02-16 NOTE — Procedures (Signed)
PATIENT'S NAME:  Harold Grimes, Harold Grimes DOB:      08/14/1965      MR#:    [id number]     DATE OF RECORDING: 02/06/2020  Harold Grimes REFERRING M.D.:  [facility]- [facility], PCP is Jarold Motto, [place] Study Performed:   Polysomnogram with expanded montage for EEG, Parasomnia.  HISTORY:   Harold Grimes is a 54- year- old multiracial male patient, who is seen here upon referral on 01/10/2020 from [facility] for a sleep consultation and PSG/ HST study.   Chief concern: " I wake up choking, I snore, I am very sleepy"   I had the pleasure of seeing Alejo Beamer on 01-10-2020, a right -handed male with a medical history of Diabetes mellitus with complication (HCC) of erectile dysfunction, Heart murmur in childhood, Hyperlipidemia, Hypertension and PTSD- insomnia related to shift work and to PTSD, with flash backs/ vivid dreams.   The patient endorsed the Epworth Sleepiness Scale at 21/24 points.   The patient's weight 212 pounds with a height of 69 (inches), resulting in a BMI of 31.3 kg/m2. The patient's neck circumference measured 18.5 inches.  CURRENT MEDICATIONS: Lamisil, Cialis, Ozempic, Advil, Lipitor, ASA 81mg    PROCEDURE:  This is a multichannel digital polysomnogram utilizing the Somnostar 11.2 system.  Electrodes and sensors were applied and monitored per AASM Specifications.   EEG, EOG, Chin and Limb EMG, were sampled at 200 Hz.  ECG, Snore and Nasal Pressure, Thermal Airflow, Respiratory Effort, CPAP Flow and Pressure, Oximetry was sampled at 50 Hz. Digital video and audio were recorded.      BASELINE STUDY: Lights Out was at 20:51 and Lights On at 04:31.  Total recording time (TRT) was 460 minutes, with a total sleep time (TST) of 353 minutes.   The patient's sleep latency was 65.5 minutes.  REM latency was 80.5 minutes.  The sleep efficiency was 76.7 %.     SLEEP ARCHITECTURE: WASO (Wake after sleep onset) was 46.5 minutes.  There were 35 minutes in Stage N1, 200 minutes Stage N2, 10 minutes Stage N3 and 108 minutes in  Stage REM.  The percentage of Stage N1 was 9.9%, Stage N2 was 56.7%, Stage N3 was 2.8% and Stage R (REM sleep) was 30.6%.   RESPIRATORY ANALYSIS:  There were a total of 189 respiratory events:  32 obstructive apneas and 157 hypopneas. The patient also had several respiratory event related arousals (RERAs).     The total APNEA/HYPOPNEA INDEX (AHI) was 32.1/hour and the total RESPIRATORY DISTURBANCE INDEX was 35.1 /hour.  70 events occurred in REM sleep and 174 events in NREM.  The REM AHI was 38.9 /hour, versus a non-REM AHI of 29.1/h.  The patient spent 68 minutes of total sleep time in the supine position and 285 minutes in non-supine. The supine AHI was 80.3/h versus a non-supine AHI of 20.6. (Snoring and apnea were worse during sleep on the right than left side).   OXYGEN SATURATION & C02:  The Wake baseline 02 saturation was 94%, with the lowest being 82%. Time spent below 89% saturation equaled 39 minutes.  The arousals were noted as: 28 were spontaneous, 0 were associated with PLMs, 77 were associated with respiratory events. The patient had a total of 0 Periodic Limb Movements.   Audio and video analysis did not show any abnormal or unusual movements, behaviors, phonations or vocalizations.  Loudest Snoring was noted in supine. EKG was in keeping with normal sinus rhythm (NSR).   IMPRESSION:  1. Severe Obstructive Sleep Apnea (OSA)  at an AHI of 32.1/h with prolonged oxygen desaturation (total time 39 min.) and to a nadir of 82%. Apnea was worse in supine (AHI of 80.3/h) and in REM sleep.  2. Loud Snoring was present in supine sleep and during sleep on the right side.  3. A normal EKG in NSR was noted. EEG was also normal. There was no parasomnia activity.    RECOMMENDATIONS:  1. Advise CPAP titration by auto CPAP device to improve this form and degree of apnea.  2. The patient needs to avoid supine sleep- this alone reduces apnea to a moderate degree. Sleeping on the left seemed the  least apnea prone position    3. REM dependent sleep apnea associated with oxygen desaturation is not responsive to Inspire or Dental Device Therapy. I recommend auto CPAP with a setting od 6-18 cm water pressure, 3 cm EPR and a mask of patient's choice that should be fitted while in reclined position. Heated humidification and instruction that any Aerocare / adapt patient can return a mask within 30 days of use if uncomfortable and mask will be exchanged at no costs.  4. Advise of supply chain delays affecting CPAP delivery date.     I certify that I have reviewed the entire raw data recording prior to the issuance of this report in accordance with the Standards of Accreditation of the American Academy of Sleep Medicine (AASM)    Melvyn Novas, MD Diplomat, American Board of Neurology  Diplomat, American Board of Sleep Medicine Wellsite geologist, [facility] [employer]

## 2020-02-18 ENCOUNTER — Telehealth: Payer: Self-pay | Admitting: Neurology

## 2020-02-18 DIAGNOSIS — F4310 Post-traumatic stress disorder, unspecified: Secondary | ICD-10-CM

## 2020-02-18 DIAGNOSIS — R29818 Other symptoms and signs involving the nervous system: Secondary | ICD-10-CM

## 2020-02-18 DIAGNOSIS — G4719 Other hypersomnia: Secondary | ICD-10-CM

## 2020-02-18 DIAGNOSIS — G4710 Hypersomnia, unspecified: Secondary | ICD-10-CM

## 2020-02-18 DIAGNOSIS — G4730 Sleep apnea, unspecified: Secondary | ICD-10-CM

## 2020-02-18 DIAGNOSIS — F410 Panic disorder [episodic paroxysmal anxiety] without agoraphobia: Secondary | ICD-10-CM

## 2020-02-18 NOTE — Telephone Encounter (Signed)
-----   Message from Melvyn Novas, MD sent at 02/16/2020  5:56 PM EST ----- [facility] may need a CC. IMPRESSION:   1. Severe Obstructive Sleep Apnea (OSA) at an AHI of 32.1/h with  prolonged oxygen desaturation (total time 39 min.) and to a nadir  of 82%. Apnea was worse in supine (AHI of 80.3/h) and in REM  sleep.  2. Loud Snoring was present in supine sleep and during sleep on  the right side.  3. A normal EKG in NSR was noted. EEG was also normal. There was  no parasomnia activity.    RECOMMENDATIONS:   1. Advise CPAP titration by auto CPAP device to improve this form  and degree of apnea.  2. The patient needs to avoid supine sleep- this alone reduces  apnea to a moderate degree. Sleeping on the left seemed the least  apnea prone position   3. REM dependent sleep apnea associated with oxygen desaturation  is not responsive to Inspire or Dental Device Therapy. I  recommend auto CPAP with a setting od 6-18 cm water pressure, 3  cm EPR and a mask of patient's choice that should be fitted while  in reclined position. Heated humidification and instruction that  any Aerocare / adapt patient can return a mask within 30 days of  use if uncomfortable and mask will be exchanged at no costs.  4. Advise of supply chain delays affecting CPAP delivery date.

## 2020-02-18 NOTE — Telephone Encounter (Signed)
I called pt. I advised pt that Dr. Vickey Huger reviewed their sleep study results and found that severe sleep apnea and recommends that pt be treated with a cpap. Dr. Vickey Huger recommends that pt return for a repeat sleep study in order to properly titrate the cpap and ensure a good mask fit. Pt is agreeable to returning for a titration study. I advised pt that our sleep lab will file with pt's insurance and call pt to schedule the sleep study when we hear back from the pt's insurance regarding coverage of this sleep study. Pt verbalized understanding of results. Pt had no questions at this time but was encouraged to call back if questions arise.  Advised the patient that if this is not covered under insurance then I will be in contact with plan B.

## 2020-02-28 DIAGNOSIS — M62830 Muscle spasm of back: Secondary | ICD-10-CM | POA: Diagnosis not present

## 2020-02-28 DIAGNOSIS — M545 Low back pain, unspecified: Secondary | ICD-10-CM | POA: Diagnosis not present

## 2020-02-28 DIAGNOSIS — M9901 Segmental and somatic dysfunction of cervical region: Secondary | ICD-10-CM | POA: Diagnosis not present

## 2020-02-28 DIAGNOSIS — M9903 Segmental and somatic dysfunction of lumbar region: Secondary | ICD-10-CM | POA: Diagnosis not present

## 2020-03-03 ENCOUNTER — Ambulatory Visit: Payer: Federal, State, Local not specified - PPO | Admitting: Physician Assistant

## 2020-03-04 ENCOUNTER — Other Ambulatory Visit: Payer: Self-pay

## 2020-03-04 ENCOUNTER — Encounter: Payer: Self-pay | Admitting: Physician Assistant

## 2020-03-04 ENCOUNTER — Ambulatory Visit: Payer: Federal, State, Local not specified - PPO | Admitting: Physician Assistant

## 2020-03-04 VITALS — BP 140/88 | HR 94 | Temp 98.1°F | Ht 69.0 in | Wt 219.0 lb

## 2020-03-04 DIAGNOSIS — K635 Polyp of colon: Secondary | ICD-10-CM | POA: Diagnosis not present

## 2020-03-04 DIAGNOSIS — Z1159 Encounter for screening for other viral diseases: Secondary | ICD-10-CM | POA: Diagnosis not present

## 2020-03-04 DIAGNOSIS — M9903 Segmental and somatic dysfunction of lumbar region: Secondary | ICD-10-CM | POA: Diagnosis not present

## 2020-03-04 DIAGNOSIS — Z114 Encounter for screening for human immunodeficiency virus [HIV]: Secondary | ICD-10-CM

## 2020-03-04 DIAGNOSIS — Z1211 Encounter for screening for malignant neoplasm of colon: Secondary | ICD-10-CM | POA: Diagnosis not present

## 2020-03-04 DIAGNOSIS — E119 Type 2 diabetes mellitus without complications: Secondary | ICD-10-CM | POA: Diagnosis not present

## 2020-03-04 DIAGNOSIS — M9901 Segmental and somatic dysfunction of cervical region: Secondary | ICD-10-CM | POA: Diagnosis not present

## 2020-03-04 DIAGNOSIS — M545 Low back pain, unspecified: Secondary | ICD-10-CM | POA: Diagnosis not present

## 2020-03-04 DIAGNOSIS — B351 Tinea unguium: Secondary | ICD-10-CM

## 2020-03-04 DIAGNOSIS — M62830 Muscle spasm of back: Secondary | ICD-10-CM | POA: Diagnosis not present

## 2020-03-04 LAB — POCT GLYCOSYLATED HEMOGLOBIN (HGB A1C): Hemoglobin A1C: 5.9 % — AB (ref 4.0–5.6)

## 2020-03-04 MED ORDER — OZEMPIC (0.25 OR 0.5 MG/DOSE) 2 MG/1.5ML ~~LOC~~ SOPN
0.5000 mg | PEN_INJECTOR | SUBCUTANEOUS | 9 refills | Status: DC
Start: 2020-03-04 — End: 2020-04-21

## 2020-03-04 MED ORDER — TERBINAFINE HCL 250 MG PO TABS: 250.0000 mg | ORAL_TABLET | Freq: Every day | ORAL | 0 refills | Status: AC

## 2020-03-04 NOTE — Patient Instructions (Signed)
It was great to see you!  A1c has improved from 6.7 to 5.9%.  I have refilled your terbinafine for your toe. I will be in touch with your blood work results to make sure you can safely start this.  Let's follow-up in 9 months, sooner if you have concerns.  Take care,  Jarold Motto PA-C

## 2020-03-04 NOTE — Progress Notes (Signed)
Harold Grimes is a 54 y.o. male is here for follow up.  I acted as a Neurosurgeon for [employer], PA-C Harold Mull, LPN   History of Present Illness:   Chief Complaint  Patient presents with  . Diabetes    HPI  Diabetes Pt here for follow up, currently using Ozempic 0.5 mg once a week. Tolerating medication well. Denies nausea or diarrhea. Pt is checking sugars at home averaging 150 fasting.  Wt Readings from Last 5 Encounters:  03/04/20 219 lb (99.3 kg)  01/10/20 212 lb (96.2 kg)  01/04/20 218 lb (98.9 kg)  11/28/19 221 lb (100.2 kg)   Colonoscopy Gets these yearly, per his report, due to high polyp burden. Will put in referral.  Toenail fungus Recently completed 6 weeks of terbinafine. This has helped his symptoms and is wondering if he should extend the course.   Health Maintenance Due  Topic Date Due  . Hepatitis C Screening  Never done  . COLONOSCOPY  Never done    Past Medical History:  Diagnosis Date  . Diabetes mellitus without complication (HCC)   . Heart murmur   . Hyperlipidemia   . Hypertension      Social History   Tobacco Use  . Smoking status: Former Smoker    Types: Cigarettes  . Smokeless tobacco: Never Used  . Tobacco comment: quit 2007  Vaping Use  . Vaping Use: Never used  Substance Use Topics  . Alcohol use: Yes    Alcohol/week: 2.0 - 3.0 standard drinks    Types: 2 - 3 Cans of beer per week  . Drug use: Never    Past Surgical History:  Procedure Laterality Date  . DENTAL SURGERY  11/12/2019   Laser sx on gums  . PELVIC FRACTURE SURGERY  1993    Family History  Problem Relation Age of Onset  . Alcohol abuse Father   . Cancer Father   . Heart attack Father   . Drug abuse Sister   . Hearing loss Brother   . Heart disease Brother   . Cancer Paternal Grandmother   . Cancer Paternal Grandfather   . Heart attack Paternal Grandfather   . Drug abuse Brother     PMHx, SurgHx, SocialHx, FamHx, Medications, and Allergies  were reviewed in the Visit Navigator and updated as appropriate.   Patient Active Problem List   Diagnosis Date Noted  . Other insomnia 01/10/2020  . Loud snoring 01/10/2020  . Excessive daytime sleepiness 01/10/2020  . Panic attack due to post traumatic stress disorder (PTSD) 01/10/2020  . Suspected sleep apnea 01/10/2020  . Hypersomnia with sleep apnea 01/10/2020  . Diabetes mellitus (HCC) 11/28/2019  . Essential hypertension 11/28/2019  . Hyperlipidemia associated with type 2 diabetes mellitus (HCC) 11/28/2019  . Sleep apnea-like behavior 11/28/2019  . Toenail fungus 11/28/2019  . Erectile dysfunction 11/28/2019    Social History   Tobacco Use  . Smoking status: Former Smoker    Types: Cigarettes  . Smokeless tobacco: Never Used  . Tobacco comment: quit 2007  Vaping Use  . Vaping Use: Never used  Substance Use Topics  . Alcohol use: Yes    Alcohol/week: 2.0 - 3.0 standard drinks    Types: 2 - 3 Cans of beer per week  . Drug use: Never    Current Medications and Allergies:    Current Outpatient Medications:  .  aspirin 81 MG chewable tablet, Chew by mouth., Disp: , Rfl:  .  atorvastatin (LIPITOR) 40  MG tablet, Take 40 mg by mouth at bedtime., Disp: , Rfl:  .  ibuprofen (ADVIL) 200 MG tablet, Take by mouth., Disp: , Rfl:  .  Semaglutide,0.25 or 0.5MG /DOS, (OZEMPIC, 0.25 OR 0.5 MG/DOSE,) 2 MG/1.5ML SOPN, Inject 0.5 mg into the skin once a week., Disp: 1.5 mL, Rfl: 3 .  tadalafil (CIALIS) 20 MG tablet, Take 0.5-1 tablets (10-20 mg total) by mouth every other day as needed for erectile dysfunction., Disp: 5 tablet, Rfl: 11 .  terbinafine (LAMISIL) 250 MG tablet, Take 1 tablet (250 mg total) by mouth daily., Disp: 42 tablet, Rfl: 0  No Known Allergies  Review of Systems   ROS  Negative unless otherwise specified per HPI.  Vitals:   Vitals:   03/04/20 0901  BP: 140/88  Pulse: 94  Temp: 98.1 F (36.7 C)  TempSrc: Temporal  SpO2: 95%  Weight: 219 lb (99.3 kg)   Height: 5\' 9"  (1.753 m)     Body mass index is 32.34 kg/m.   Physical Exam:    Physical Exam Vitals and nursing note reviewed.  Constitutional:      General: He is not in acute distress.    Appearance: He is well-developed. He is not ill-appearing, toxic-appearing or sickly-appearing.  Cardiovascular:     Rate and Rhythm: Normal rate and regular rhythm.     Pulses: Normal pulses.     Heart sounds: Normal heart sounds, S1 normal and S2 normal.     Comments: No LE edema Pulmonary:     Effort: Pulmonary effort is normal.     Breath sounds: Normal breath sounds.  Skin:    General: Skin is warm, dry and intact.  Neurological:     Mental Status: He is alert.     GCS: GCS eye subscore is 4. GCS verbal subscore is 5. GCS motor subscore is 6.  Psychiatric:        Mood and Affect: Mood and affect normal.        Speech: Speech normal.        Behavior: Behavior normal. Behavior is cooperative.      Assessment and Plan:    Harold Grimes was seen today for diabetes.  Diagnoses and all orders for this visit:  Type 2 diabetes mellitus without complication, without long-term current use of insulin (HCC) HgbA1c is well controlled. Continue Ozempic 0.5 mg weekly. Follow-up in 9 months, sooner if concerns. -     Comprehensive metabolic panel; Future  Polyp of colon, unspecified part of colon, unspecified type; Special screening for malignant neoplasms, colon Referral for colonoscopy. -     Ambulatory referral to Gastroenterology -     POCT HgB A1C  Screening for HIV (human immunodeficiency virus) -     HIV Antibody (routine testing w rflx); Future  Encounter for screening for other viral diseases -     Hepatitis C Antibody; Future  Toenail fungus Repeat lamisil x 6 weeks. Update liver enzymes today.  Other orders -     terbinafine (LAMISIL) 250 MG tablet; Take 1 tablet (250 mg total) by mouth daily.   CMA or LPN served as scribe during this visit. History, Physical, and Plan  performed by medical provider. The above documentation has been reviewed and is accurate and complete.   Harold Maduro, PA-C [place] [place] 03/04/2020  Follow-up: No follow-ups on file.

## 2020-03-05 LAB — COMPREHENSIVE METABOLIC PANEL
AG Ratio: 1.8 (calc) (ref 1.0–2.5)
ALT: 28 U/L (ref 9–46)
AST: 20 U/L (ref 10–35)
Albumin: 4.7 g/dL (ref 3.6–5.1)
Alkaline phosphatase (APISO): 79 U/L (ref 35–144)
BUN: 20 mg/dL (ref 7–25)
CO2: 27 mmol/L (ref 20–32)
Calcium: 10 mg/dL (ref 8.6–10.3)
Chloride: 105 mmol/L (ref 98–110)
Creat: 0.89 mg/dL (ref 0.70–1.33)
Globulin: 2.6 g/dL (calc) (ref 1.9–3.7)
Glucose, Bld: 132 mg/dL — ABNORMAL HIGH (ref 65–99)
Potassium: 4.5 mmol/L (ref 3.5–5.3)
Sodium: 140 mmol/L (ref 135–146)
Total Bilirubin: 0.6 mg/dL (ref 0.2–1.2)
Total Protein: 7.3 g/dL (ref 6.1–8.1)

## 2020-03-05 LAB — HEPATITIS C ANTIBODY
Hepatitis C Ab: NONREACTIVE
SIGNAL TO CUT-OFF: 0.01 (ref <1.00)

## 2020-03-05 LAB — HIV ANTIBODY (ROUTINE TESTING W REFLEX): HIV 1&2 Ab, 4th Generation: NONREACTIVE

## 2020-03-20 DIAGNOSIS — M62830 Muscle spasm of back: Secondary | ICD-10-CM | POA: Diagnosis not present

## 2020-03-20 DIAGNOSIS — M9903 Segmental and somatic dysfunction of lumbar region: Secondary | ICD-10-CM | POA: Diagnosis not present

## 2020-03-20 DIAGNOSIS — M545 Low back pain, unspecified: Secondary | ICD-10-CM | POA: Diagnosis not present

## 2020-03-20 DIAGNOSIS — M9901 Segmental and somatic dysfunction of cervical region: Secondary | ICD-10-CM | POA: Diagnosis not present

## 2020-03-24 DIAGNOSIS — M9903 Segmental and somatic dysfunction of lumbar region: Secondary | ICD-10-CM | POA: Diagnosis not present

## 2020-03-24 DIAGNOSIS — M545 Low back pain, unspecified: Secondary | ICD-10-CM | POA: Diagnosis not present

## 2020-03-24 DIAGNOSIS — M62830 Muscle spasm of back: Secondary | ICD-10-CM | POA: Diagnosis not present

## 2020-03-24 DIAGNOSIS — M9901 Segmental and somatic dysfunction of cervical region: Secondary | ICD-10-CM | POA: Diagnosis not present

## 2020-03-25 DIAGNOSIS — M545 Low back pain, unspecified: Secondary | ICD-10-CM | POA: Diagnosis not present

## 2020-03-25 DIAGNOSIS — M9903 Segmental and somatic dysfunction of lumbar region: Secondary | ICD-10-CM | POA: Diagnosis not present

## 2020-03-25 DIAGNOSIS — M9901 Segmental and somatic dysfunction of cervical region: Secondary | ICD-10-CM | POA: Diagnosis not present

## 2020-03-25 DIAGNOSIS — M62830 Muscle spasm of back: Secondary | ICD-10-CM | POA: Diagnosis not present

## 2020-03-27 DIAGNOSIS — M62830 Muscle spasm of back: Secondary | ICD-10-CM | POA: Diagnosis not present

## 2020-03-27 DIAGNOSIS — M9903 Segmental and somatic dysfunction of lumbar region: Secondary | ICD-10-CM | POA: Diagnosis not present

## 2020-03-27 DIAGNOSIS — M9901 Segmental and somatic dysfunction of cervical region: Secondary | ICD-10-CM | POA: Diagnosis not present

## 2020-03-27 DIAGNOSIS — M545 Low back pain, unspecified: Secondary | ICD-10-CM | POA: Diagnosis not present

## 2020-03-31 DIAGNOSIS — M62830 Muscle spasm of back: Secondary | ICD-10-CM | POA: Diagnosis not present

## 2020-03-31 DIAGNOSIS — M545 Low back pain, unspecified: Secondary | ICD-10-CM | POA: Diagnosis not present

## 2020-03-31 DIAGNOSIS — M9903 Segmental and somatic dysfunction of lumbar region: Secondary | ICD-10-CM | POA: Diagnosis not present

## 2020-03-31 DIAGNOSIS — M9901 Segmental and somatic dysfunction of cervical region: Secondary | ICD-10-CM | POA: Diagnosis not present

## 2020-04-01 DIAGNOSIS — M62830 Muscle spasm of back: Secondary | ICD-10-CM | POA: Diagnosis not present

## 2020-04-01 DIAGNOSIS — M545 Low back pain, unspecified: Secondary | ICD-10-CM | POA: Diagnosis not present

## 2020-04-01 DIAGNOSIS — M9901 Segmental and somatic dysfunction of cervical region: Secondary | ICD-10-CM | POA: Diagnosis not present

## 2020-04-01 DIAGNOSIS — M9903 Segmental and somatic dysfunction of lumbar region: Secondary | ICD-10-CM | POA: Diagnosis not present

## 2020-04-03 ENCOUNTER — Other Ambulatory Visit: Payer: Self-pay

## 2020-04-03 ENCOUNTER — Ambulatory Visit (INDEPENDENT_AMBULATORY_CARE_PROVIDER_SITE_OTHER): Payer: Federal, State, Local not specified - PPO | Admitting: Neurology

## 2020-04-03 DIAGNOSIS — G4730 Sleep apnea, unspecified: Secondary | ICD-10-CM

## 2020-04-03 DIAGNOSIS — G4710 Hypersomnia, unspecified: Secondary | ICD-10-CM

## 2020-04-03 DIAGNOSIS — G4719 Other hypersomnia: Secondary | ICD-10-CM

## 2020-04-03 DIAGNOSIS — R29818 Other symptoms and signs involving the nervous system: Secondary | ICD-10-CM

## 2020-04-03 DIAGNOSIS — G4733 Obstructive sleep apnea (adult) (pediatric): Secondary | ICD-10-CM | POA: Diagnosis not present

## 2020-04-03 DIAGNOSIS — F4310 Post-traumatic stress disorder, unspecified: Secondary | ICD-10-CM

## 2020-04-07 DIAGNOSIS — M9901 Segmental and somatic dysfunction of cervical region: Secondary | ICD-10-CM | POA: Diagnosis not present

## 2020-04-07 DIAGNOSIS — M62830 Muscle spasm of back: Secondary | ICD-10-CM | POA: Diagnosis not present

## 2020-04-07 DIAGNOSIS — M9903 Segmental and somatic dysfunction of lumbar region: Secondary | ICD-10-CM | POA: Diagnosis not present

## 2020-04-07 DIAGNOSIS — M545 Low back pain, unspecified: Secondary | ICD-10-CM | POA: Diagnosis not present

## 2020-04-07 NOTE — Procedures (Signed)
PATIENT'S NAME:  Harold Grimes, Harold Grimes DOB:      1965-05-13      MR#:    [id number]     DATE OF RECORDING: 04/03/2020  Violet Baldy REFERRING M.D.:  [facility] -  Study Performed:   CPAP  Titration following a PSG from 02-05-2021 HISTORY:  Excessive daytime Sleepiness-  The preceding PSG documented an AHI of 32.1/h and supine AHI of 80/h. with prolonged Sleep Hypoxemia, Nadir 82%, and Loud snoring.   The patient endorsed the Epworth Sleepiness Scale at 21 points.   The patient's weight 212 pounds with a height of 69 (inches), resulting in a BMI of 31.3 kg/m2. The patient's neck circumference measured 18.5 inches.  CURRENT MEDICATIONS: Lamisil, Cialis, Ozempic, Advil, Lipitor, ASA 81mg     PROCEDURE:  This is a multichannel digital polysomnogram utilizing the SomnoStar 11.2 system.  Electrodes and sensors were applied and monitored per AASM Specifications.   EEG, EOG, Chin and Limb EMG, were sampled at 200 Hz.  ECG, Snore and Nasal Pressure, Thermal Airflow, Respiratory Effort, CPAP Flow and Pressure, Oximetry was sampled at 50 Hz. Digital video and audio were recorded.       CPAP was initiated using a Medium Simplus Full Face mask. Beginning at 5 cmH20 with heated humidity per AASM standards, the pressure was advanced to 10 cmH20 because of hypopneas, apneas and desaturations.  At a PAP pressure of 10 cmH20, there was a reduction of the AHI to 0.0 with improvement of sleep apnea for a sleep time of 39 minutes, including  REM sleep .  Lights Out was at 21:49 and Lights On at 04:58. Total recording time (TRT) was 429.5 minutes, with a total sleep time (TST) of 396 minutes. The patient's sleep latency was 20.5 minutes. REM latency was 77.5 minutes.  The sleep efficiency was 92.2 %.    SLEEP ARCHITECTURE: WASO (Wake after sleep onset) was 29 minutes.  There were 26.5 minutes in Stage N1, 246 minutes Stage N2, 1.5 minutes Stage N3 and 122 minutes in Stage REM.  The percentage of Stage N1 was 6.7%, Stage N2 was  62.1%, Stage N3 was .4% and Stage R (REM sleep) was 30.8%.   RESPIRATORY ANALYSIS:  There was a total of 2 respiratory events: 1 obstructive apnea, 1 central apnea and 0 mixed apneas with 0 hypopneas .  The total APNEA/HYPOPNEA INDEX  (AHI) was 0.3 /hour and the total RESPIRATORY DISTURBANCE INDEX was 0.3 /hour  1 event occurred in REM sleep and 1 event in NREM. The REM AHI was 0.5 /hour versus a non-REM AHI of 0.2 /hour.  The patient spent 196.5 minutes of total sleep time in the supine position and 200 minutes in non-supine. The supine AHI was 0.6/h, versus a non-supine AHI of 0.0/h.  OXYGEN SATURATION & C02:  The baseline 02 saturation was 94%, with the lowest being 88%. Time spent below 89% saturation equaled 0 minutes.  The arousals were noted as: 24 were spontaneous, 4 were associated with PLMs, and only 1 was associated with respiratory events. The patient had a total of 13 Periodic Limb Movements. The Periodic Limb Movement (PLM) index Arousal index was 0.6 /hour. Audio and video analysis did not show any abnormal or unusual movements, behaviors, phonations or vocalizations.   Snoring was controlled. EKG was in keeping with normal sinus rhythm.   DIAGNOSIS Obstructive Sleep Apnea responded well to CPAP at 10 cm water. The patient was fitted with a Medium Simplus Full Face mask.   PLANS/RECOMMENDATIONS: 1.  Medium Simplus Full Face mask and CPAP autotitration device with pressure settings from 5-16 cm water, 2 cm EPR and heated humidification. Please advise o patient of the option to change mask within 30 days of therapy at no costs should issues arise.  2. Achieve and maintain BMI under 30 kg/m2. 3. CPAP therapy compliance is defined as 4 hours or more with nightly use.  4. Any apnea patient should avoid or reduce sedatives, hypnotics, and alcohol consumption at bedtime.    DISCUSSION: A follow up appointment will be scheduled with NP in the Sleep Clinic at [facility].   Please call [phone] with any questions.      I certify that I have reviewed the entire raw data recording prior to the issuance of this report in accordance with the Standards of Accreditation of the American Academy of Sleep Medicine (AASM)  Melvyn Novas, M.D. Diplomat, Biomedical engineer of Psychiatry and Neurology  Diplomat, Biomedical engineer of Sleep Medicine Wellsite geologist, [facility] [employer]

## 2020-04-07 NOTE — Addendum Note (Signed)
Addended by: Melvyn Novas on: 04/07/2020 05:09 PM   Modules accepted: Orders

## 2020-04-07 NOTE — Progress Notes (Signed)
[facility] patient - here for CPAP titration: DIAGNOSIS Obstructive Sleep Apnea responded well to CPAP at 10 cm water. The patient was fitted with a Medium Simplus Full Face mask.   PLANS/RECOMMENDATIONS: 1. Medium Simplus Full Face mask and CPAP autotitration device with pressure settings from 5-16 cm water, 2 cm EPR and heated humidification. Please advise o patient of the option to change mask within 30 days of therapy at no costs should issues arise.  2. Achieve and maintain BMI under 30 kg/m2. 3. CPAP therapy compliance is defined as 4 hours or more with nightly use.  4. Any apnea patient should avoid or reduce sedatives, hypnotics, and alcohol consumption at bedtime.    DISCUSSION: A follow up appointment will be scheduled with NP in the Sleep Clinic at [facility].   Please call [phone] with any questions.

## 2020-04-08 ENCOUNTER — Encounter: Payer: Self-pay | Admitting: Neurology

## 2020-04-08 ENCOUNTER — Telehealth: Payer: Self-pay | Admitting: Neurology

## 2020-04-08 DIAGNOSIS — M9901 Segmental and somatic dysfunction of cervical region: Secondary | ICD-10-CM | POA: Diagnosis not present

## 2020-04-08 DIAGNOSIS — M9903 Segmental and somatic dysfunction of lumbar region: Secondary | ICD-10-CM | POA: Diagnosis not present

## 2020-04-08 DIAGNOSIS — M62830 Muscle spasm of back: Secondary | ICD-10-CM | POA: Diagnosis not present

## 2020-04-08 DIAGNOSIS — M545 Low back pain, unspecified: Secondary | ICD-10-CM | POA: Diagnosis not present

## 2020-04-08 NOTE — Telephone Encounter (Signed)
Called patient to discuss sleep study results. No answer at this time. LVM for the patient to call back.  Will send a mychart message as well. 

## 2020-04-08 NOTE — Telephone Encounter (Signed)
-----   Message from Melvyn Novas, MD sent at 04/07/2020  5:09 PM EST ----- [facility] patient - here for CPAP titration: DIAGNOSIS Obstructive Sleep Apnea responded well to CPAP at 10 cm water. The patient was fitted with a Medium Simplus Full Face mask.   PLANS/RECOMMENDATIONS: 1. Medium Simplus Full Face mask and CPAP autotitration device with pressure settings from 5-16 cm water, 2 cm EPR and heated humidification. Please advise o patient of the option to change mask within 30 days of therapy at no costs should issues arise.  2. Achieve and maintain BMI under 30 kg/m2. 3. CPAP therapy compliance is defined as 4 hours or more with nightly use.  4. Any apnea patient should avoid or reduce sedatives, hypnotics, and alcohol consumption at bedtime.    DISCUSSION: A follow up appointment will be scheduled with NP in the Sleep Clinic at [facility].   Please call [phone] with any questions.

## 2020-04-10 DIAGNOSIS — M9901 Segmental and somatic dysfunction of cervical region: Secondary | ICD-10-CM | POA: Diagnosis not present

## 2020-04-10 DIAGNOSIS — M62830 Muscle spasm of back: Secondary | ICD-10-CM | POA: Diagnosis not present

## 2020-04-10 DIAGNOSIS — M9903 Segmental and somatic dysfunction of lumbar region: Secondary | ICD-10-CM | POA: Diagnosis not present

## 2020-04-10 DIAGNOSIS — M545 Low back pain, unspecified: Secondary | ICD-10-CM | POA: Diagnosis not present

## 2020-04-14 DIAGNOSIS — M9901 Segmental and somatic dysfunction of cervical region: Secondary | ICD-10-CM | POA: Diagnosis not present

## 2020-04-14 DIAGNOSIS — M545 Low back pain, unspecified: Secondary | ICD-10-CM | POA: Diagnosis not present

## 2020-04-14 DIAGNOSIS — M62830 Muscle spasm of back: Secondary | ICD-10-CM | POA: Diagnosis not present

## 2020-04-14 DIAGNOSIS — M9903 Segmental and somatic dysfunction of lumbar region: Secondary | ICD-10-CM | POA: Diagnosis not present

## 2020-04-15 DIAGNOSIS — M62838 Other muscle spasm: Secondary | ICD-10-CM | POA: Diagnosis not present

## 2020-04-15 DIAGNOSIS — M9903 Segmental and somatic dysfunction of lumbar region: Secondary | ICD-10-CM | POA: Diagnosis not present

## 2020-04-15 DIAGNOSIS — M9901 Segmental and somatic dysfunction of cervical region: Secondary | ICD-10-CM | POA: Diagnosis not present

## 2020-04-15 DIAGNOSIS — M62830 Muscle spasm of back: Secondary | ICD-10-CM | POA: Diagnosis not present

## 2020-04-15 DIAGNOSIS — M545 Low back pain, unspecified: Secondary | ICD-10-CM | POA: Diagnosis not present

## 2020-04-20 ENCOUNTER — Other Ambulatory Visit: Payer: Self-pay | Admitting: Physician Assistant

## 2020-05-01 ENCOUNTER — Encounter: Payer: Self-pay | Admitting: Neurology

## 2020-05-03 ENCOUNTER — Encounter: Payer: Self-pay | Admitting: Physician Assistant

## 2020-05-05 MED ORDER — ATORVASTATIN CALCIUM 40 MG PO TABS: 40.0000 mg | ORAL_TABLET | Freq: Every day | ORAL | 1 refills | Status: AC

## 2020-06-04 ENCOUNTER — Encounter: Payer: Self-pay | Admitting: Neurology
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
## 2020-06-05 ENCOUNTER — Encounter: Payer: Self-pay | Admitting: Neurology

## 2020-06-05 NOTE — Telephone Encounter (Signed)
Letter is written, CD

## 2020-08-01 ENCOUNTER — Encounter: Payer: Self-pay | Admitting: Neurology

## 2020-08-29 DIAGNOSIS — G4733 Obstructive sleep apnea (adult) (pediatric): Secondary | ICD-10-CM | POA: Diagnosis not present

## 2020-09-12 DIAGNOSIS — G4733 Obstructive sleep apnea (adult) (pediatric): Secondary | ICD-10-CM | POA: Diagnosis not present

## 2020-09-25 ENCOUNTER — Emergency Department (HOSPITAL_BASED_OUTPATIENT_CLINIC_OR_DEPARTMENT_OTHER): Payer: Federal, State, Local not specified - PPO

## 2020-09-25 ENCOUNTER — Emergency Department (HOSPITAL_BASED_OUTPATIENT_CLINIC_OR_DEPARTMENT_OTHER)
Admission: EM | Admit: 2020-09-25 | Discharge: 2020-09-25 | Disposition: A | Discharge status: Home / Self Care | Payer: Federal, State, Local not specified - PPO | Attending: Emergency Medicine | Admitting: Emergency Medicine

## 2020-09-25 ENCOUNTER — Encounter (HOSPITAL_BASED_OUTPATIENT_CLINIC_OR_DEPARTMENT_OTHER): Payer: Self-pay | Admitting: Emergency Medicine

## 2020-09-25 ENCOUNTER — Other Ambulatory Visit: Payer: Self-pay

## 2020-09-25 DIAGNOSIS — Z79899 Other long term (current) drug therapy: Secondary | ICD-10-CM | POA: Insufficient documentation

## 2020-09-25 DIAGNOSIS — R0602 Shortness of breath: Secondary | ICD-10-CM | POA: Insufficient documentation

## 2020-09-25 DIAGNOSIS — E785 Hyperlipidemia, unspecified: Secondary | ICD-10-CM | POA: Insufficient documentation

## 2020-09-25 DIAGNOSIS — Z7982 Long term (current) use of aspirin: Secondary | ICD-10-CM | POA: Insufficient documentation

## 2020-09-25 DIAGNOSIS — K449 Diaphragmatic hernia without obstruction or gangrene: Secondary | ICD-10-CM | POA: Diagnosis not present

## 2020-09-25 DIAGNOSIS — E1169 Type 2 diabetes mellitus with other specified complication: Secondary | ICD-10-CM | POA: Diagnosis not present

## 2020-09-25 DIAGNOSIS — R0789 Other chest pain: Secondary | ICD-10-CM | POA: Insufficient documentation

## 2020-09-25 DIAGNOSIS — Z87891 Personal history of nicotine dependence: Secondary | ICD-10-CM | POA: Diagnosis not present

## 2020-09-25 DIAGNOSIS — I10 Essential (primary) hypertension: Secondary | ICD-10-CM | POA: Insufficient documentation

## 2020-09-25 DIAGNOSIS — R079 Chest pain, unspecified: Secondary | ICD-10-CM | POA: Diagnosis not present

## 2020-09-25 DIAGNOSIS — K8020 Calculus of gallbladder without cholecystitis without obstruction: Secondary | ICD-10-CM | POA: Diagnosis not present

## 2020-09-25 DIAGNOSIS — R112 Nausea with vomiting, unspecified: Secondary | ICD-10-CM | POA: Insufficient documentation

## 2020-09-25 DIAGNOSIS — J9811 Atelectasis: Secondary | ICD-10-CM | POA: Diagnosis not present

## 2020-09-25 LAB — CBC WITH DIFFERENTIAL/PLATELET
Abs Immature Granulocytes: 0.05 10*3/uL (ref 0.00–0.07)
Basophils Absolute: 0.1 10*3/uL (ref 0.0–0.1)
Basophils Relative: 1 %
Eosinophils Absolute: 0.1 10*3/uL (ref 0.0–0.5)
Eosinophils Relative: 1 %
HCT: 44.5 % (ref 39.0–52.0)
Hemoglobin: 15.9 g/dL (ref 13.0–17.0)
Immature Granulocytes: 0 %
Lymphocytes Relative: 17 %
Lymphs Abs: 2.4 10*3/uL (ref 0.7–4.0)
MCH: 29.9 pg (ref 26.0–34.0)
MCHC: 35.7 g/dL (ref 30.0–36.0)
MCV: 83.8 fL (ref 80.0–100.0)
Monocytes Absolute: 0.9 10*3/uL (ref 0.1–1.0)
Monocytes Relative: 7 %
Neutro Abs: 10.1 10*3/uL — ABNORMAL HIGH (ref 1.7–7.7)
Neutrophils Relative %: 74 %
Platelets: 289 10*3/uL (ref 150–400)
RBC: 5.31 MIL/uL (ref 4.22–5.81)
RDW: 12.9 % (ref 11.5–15.5)
WBC: 13.6 10*3/uL — ABNORMAL HIGH (ref 4.0–10.5)
nRBC: 0 % (ref 0.0–0.2)

## 2020-09-25 LAB — BASIC METABOLIC PANEL
Anion gap: 12 (ref 5–15)
BUN: 21 mg/dL — ABNORMAL HIGH (ref 6–20)
CO2: 24 mmol/L (ref 22–32)
Calcium: 9.4 mg/dL (ref 8.9–10.3)
Chloride: 104 mmol/L (ref 98–111)
Creatinine, Ser: 0.94 mg/dL (ref 0.61–1.24)
GFR, Estimated: >60 mL/min (ref >60)
Glucose, Bld: 145 mg/dL — ABNORMAL HIGH (ref 70–99)
Potassium: 4 mmol/L (ref 3.5–5.1)
Sodium: 140 mmol/L (ref 135–145)

## 2020-09-25 LAB — TROPONIN I (HIGH SENSITIVITY)
Troponin I (High Sensitivity): 3 ng/L (ref <18)
Troponin I (High Sensitivity): 3 ng/L (ref <18)

## 2020-09-25 MED ORDER — NITROGLYCERIN 0.4 MG SL SUBL
0.4000 mg | SUBLINGUAL_TABLET | SUBLINGUAL | Status: DC | PRN
  Administered 2020-09-25: 0.4 mg via SUBLINGUAL
  Filled 2020-09-25: qty 1

## 2020-09-25 MED ORDER — PANTOPRAZOLE SODIUM 40 MG IV SOLR
40.0000 mg | Freq: Once | INTRAVENOUS | Status: AC
  Administered 2020-09-25: 40 mg via INTRAVENOUS
  Filled 2020-09-25: qty 40

## 2020-09-25 MED ORDER — ASPIRIN 81 MG PO CHEW
324.0000 mg | CHEWABLE_TABLET | Freq: Once | ORAL | Status: AC
  Administered 2020-09-25: 324 mg via ORAL
  Filled 2020-09-25: qty 4

## 2020-09-25 MED ORDER — IOHEXOL 350 MG/ML SOLN
100.0000 mL | Freq: Once | INTRAVENOUS | Status: AC | PRN
  Administered 2020-09-25: 100 mL via INTRAVENOUS

## 2020-09-25 MED ORDER — ONDANSETRON HCL 4 MG/2ML IJ SOLN
4.0000 mg | Freq: Once | INTRAMUSCULAR | Status: AC
  Administered 2020-09-25: 4 mg via INTRAVENOUS
  Filled 2020-09-25: qty 2

## 2020-09-25 NOTE — ED Provider Notes (Signed)
[facility] Provider Note: Harold Dell, MD, FACEP  CSN: [id number] MRN: [id number] ARRIVAL: 09/25/20 at 0021 ROOM: DB010/DB010   CHIEF COMPLAINT  Chest Pain   HISTORY OF PRESENT ILLNESS  09/25/20 12:31 AM Harold Grimes is a 55 y.o. male who had chest pain several years ago and [place] and he had a cardiac work-up which was unremarkable.  Specifically he had a stress test on 08/15/2018 that was negative for ischemia.  He was given a prescription for nitroglycerin as a precaution and he had not used them in the subsequent 2 years.  About 8 PM yesterday evening he developed crampy abdominal pain which was moderate to severe.  About 10 PM he began vomiting which he states was severe.  He vomited multiple times.  He did not have any diarrhea with this.  About midnight he developed chest pain.  He describes the pain as a pressure in his chest like something or someone is sitting on it.  He rates it as a 9 out of 10 on arrival.  Nothing makes it better or worse.  He took 1 sublingual nitroglycerin without relief.  The pain radiates into his back but not his arms, neck or jaw.  He states he was clammy earlier but his nausea has resolved.  He has some equivocal shortness of breath with this.   Past Medical History:  Diagnosis Date   Diabetes mellitus without complication (HCC)    Heart murmur    Hyperlipidemia    Hypertension     Past Surgical History:  Procedure Laterality Date   DENTAL SURGERY  11/12/2019   Laser sx on gums   PELVIC FRACTURE SURGERY  1993    Family History  Problem Relation Age of Onset   Alcohol abuse Father    Cancer Father    Heart attack Father    Drug abuse Sister    Hearing loss Brother    Heart disease Brother    Cancer Paternal Grandmother    Cancer Paternal Grandfather    Heart attack Paternal Grandfather    Drug abuse Brother     Social History   Tobacco Use   Smoking status: Former    Types: Cigarettes   Smokeless tobacco: Never    Tobacco comments:    quit 2007  Vaping Use   Vaping Use: Never used  Substance Use Topics   Alcohol use: Yes    Alcohol/week: 2.0 - 3.0 standard drinks    Types: 2 - 3 Cans of beer per week   Drug use: Never    Prior to Admission medications   Medication Sig Start Date End Date Taking? Authorizing Provider  aspirin 81 MG chewable tablet Chew by mouth. 08/06/18   [provider]  atorvastatin (LIPITOR) 40 MG tablet Take 1 tablet (40 mg total) by mouth at bedtime. 05/05/20   Jarold Motto, PA  ibuprofen (ADVIL) 200 MG tablet Take by mouth.    [provider]  OZEMPIC, 0.25 OR 0.5 MG/DOSE, 2 MG/1.5ML SOPN INJECT 0.5MG  UNDER THE SKIN EVERY WEEK 04/21/20   Jarold Motto, PA  tadalafil (CIALIS) 20 MG tablet Take 0.5-1 tablets (10-20 mg total) by mouth every other day as needed for erectile dysfunction. 11/28/19   Jarold Motto, PA  terbinafine (LAMISIL) 250 MG tablet Take 1 tablet (250 mg total) by mouth daily. 03/04/20   Jarold Motto, PA    Allergies Patient has no known allergies.   REVIEW OF SYSTEMS  Negative except as noted here or in  the History of Present Illness.   PHYSICAL EXAMINATION  Initial Vital Signs Blood pressure 125/85, pulse (!) 105, temperature 98 F (36.7 C), temperature source Oral, resp. rate (!) 22, height 5\' 9"  (1.753 m), weight 103.9 kg, SpO2 97 %.  Examination General: Well-developed, well-nourished male in no acute distress; appearance consistent with age of record HENT: normocephalic; atraumatic Eyes: pupils equal, round and reactive to light; extraocular muscles intact Neck: supple Heart: regular rate and rhythm Lungs: clear to auscultation bilaterally; tachycardia Abdomen: soft; nondistended; nontender; no masses or hepatosplenomegaly; bowel sounds present Extremities: No deformity; full range of motion; DP pulses normal Neurologic: Awake, alert and oriented; motor function intact in all extremities and symmetric; no facial  droop Skin: Warm and dry; mottling of skin of feet Psychiatric: Grimacing   RESULTS  Summary of this visit's results, reviewed and interpreted by myself:   EKG Interpretation  Date/Time:  Thursday September 25 2020 00:27:44 EDT Ventricular Rate:  107 PR Interval:  149 QRS Duration: 89 QT Interval:  328 QTC Calculation: 438 R Axis:   55 Text Interpretation: Sinus tachycardia ECG OTHERWISE WITHIN NORMAL LIMITS No previous ECGs available Confirmed by [id number] (Paula Libra) on 09/25/2020 12:40:46 AM       Laboratory Studies: Results for orders placed or performed during the hospital encounter of 09/25/20 (from the past 24 hour(s))  CBC with Differential/Platelet     Status: Abnormal   Collection Time: 09/25/20 12:32 AM  Result Value Ref Range   WBC 13.6 (H) 4.0 - 10.5 K/uL   RBC 5.31 4.22 - 5.81 MIL/uL   Hemoglobin 15.9 13.0 - 17.0 g/dL   HCT 09/27/20 15.5 - 20.8 %   MCV 83.8 80.0 - 100.0 fL   MCH 29.9 26.0 - 34.0 pg   MCHC 35.7 30.0 - 36.0 g/dL   RDW 02.2 33.6 - 12.2 %   Platelets 289 150 - 400 K/uL   nRBC 0.0 0.0 - 0.2 %   Neutrophils Relative % 74 %   Neutro Abs 10.1 (H) 1.7 - 7.7 K/uL   Lymphocytes Relative 17 %   Lymphs Abs 2.4 0.7 - 4.0 K/uL   Monocytes Relative 7 %   Monocytes Absolute 0.9 0.1 - 1.0 K/uL   Eosinophils Relative 1 %   Eosinophils Absolute 0.1 0.0 - 0.5 K/uL   Basophils Relative 1 %   Basophils Absolute 0.1 0.0 - 0.1 K/uL   Immature Granulocytes 0 %   Abs Immature Granulocytes 0.05 0.00 - 0.07 K/uL  Basic metabolic panel     Status: Abnormal   Collection Time: 09/25/20 12:32 AM  Result Value Ref Range   Sodium 140 135 - 145 mmol/L   Potassium 4.0 3.5 - 5.1 mmol/L   Chloride 104 98 - 111 mmol/L   CO2 24 22 - 32 mmol/L   Glucose, Bld 145 (H) 70 - 99 mg/dL   BUN 21 (H) 6 - 20 mg/dL   Creatinine, Ser 09/27/20 0.61 - 1.24 mg/dL   Calcium 9.4 8.9 - 7.53 mg/dL   GFR, Estimated 00.5 >11 mL/min   Anion gap 12 5 - 15  Troponin I (High Sensitivity)     Status: None    Collection Time: 09/25/20 12:32 AM  Result Value Ref Range   Troponin I (High Sensitivity) 3 <18 ng/L  Troponin I (High Sensitivity)     Status: None   Collection Time: 09/25/20  2:32 AM  Result Value Ref Range   Troponin I (High Sensitivity) 3 <18 ng/L  Imaging Studies: DG Chest Port 1 View  Result Date: 09/25/2020 CLINICAL DATA:  Chest pain EXAM: PORTABLE CHEST 1 VIEW COMPARISON:  None. FINDINGS: Slightly conspicuous lucency seen along the outline of the aortic arch, could reflect faint pneumomediastinum versus projection of the mediastinal tissues. Remaining cardiomediastinal contours are unremarkable. Lungs are otherwise clear. Telemetry leads overlie the chest. Osseous structures are unremarkable. IMPRESSION: Crescentic lucency along the aortic arch may be related to projection of the mediastinal tissues however, cannot exclude pneumomediastinum in the given clinical setting of shortness of breath and chest pain with emesis. Correlate with clinical findings and if there is persisting concern, consider chest CT. Electronically Signed   By: Kreg ShropshirePrice  DeHay M.D.   On: 09/25/2020 00:48   CT ANGIO CHEST AORTA W/CM & OR WO/CM  Result Date: 09/25/2020 CLINICAL DATA:  Chest, back pain EXAM: CT ANGIOGRAPHY CHEST WITH CONTRAST TECHNIQUE: Multidetector CT imaging of the chest was performed using the standard protocol during bolus administration of intravenous contrast. Multiplanar CT image reconstructions and MIPs were obtained to evaluate the vascular anatomy. CONTRAST:  100mL OMNIPAQUE IOHEXOL 350 MG/ML SOLN COMPARISON:  Radiograph 09/25/2020 FINDINGS: Cardiovascular: Noncontrast CT imaging of the chest was performed initially. No abnormal hyperdense mural thickening is discernible within the limitations of motion artifact and streak artifact from high attenuation contrast media within the thoracic esophagus. Postcontrast administration there is satisfactory opacification of the arterial vasculature. The  aortic root is suboptimally assessed given cardiac pulsation artifact. The aorta is normal caliber. No acute luminal abnormality of the imaged aorta. No periaortic stranding or hemorrhage. Shared origin of the brachiocephalic and left common carotid arteries. Proximal great vessels are unremarkable and free of acute abnormality. Radiographic lucency seen along the aortic arch appears to correspond to mediastinal fat. Central pulmonary arteries are top-normal caliber. No central or lobar filling defects are identified on this non tailored examination of the pulmonary arteries. Normal heart size. No pericardial effusion. Mediastinum/Nodes: Enteric contrast media was administered during the noncontrast portion of this examination. Density of this contrast results in some streak artifact limiting assessment on the initial exam but without a discernible sites of contrast extravasation on the delayed arterial phase of imaging as the contrast has largely passed to the gastric lumen. No conspicuous esophageal thickening. No paraesophageal free fluid or gas. Small hiatal hernia. No acute abnormality of the trachea. Thyroid gland and thoracic inlet are unremarkable. No worrisome mediastinal, hilar or axillary adenopathy. Lungs/Pleura: Dependent atelectatic changes are present in the lungs. No confluent airspace disease is seen. Airways are patent. No pneumothorax or visible layering pleural effusion. No concerning pulmonary nodules or masses Upper Abdomen: Multiple calcified gallstones seen within the otherwise normal gallbladder. No acute abnormalities present in the visualized portions of the upper abdomen. Musculoskeletal: No acute osseous abnormality or suspicious osseous lesion. Minimal degenerative changes in the spine. No worrisome chest wall mass or lesion. Review of the MIP images confirms the above findings. IMPRESSION: 1. No evidence of a concordant dissection or other acute aortic syndrome. 2. Conspicuous lucency  along the aortic arch appears to correspond to a layer of mediastinal fat extending across the arch. No evidence of pneumomediastinum. 3. No evidence of pneumomediastinum nor acute tracheo-esophageal abnormality. 4. Dependent atelectasis without other acute cardiopulmonary findings. Electronically Signed   By: Kreg ShropshirePrice  DeHay M.D.   On: 09/25/2020 01:51    ED COURSE and MDM  Nursing notes, initial and subsequent vitals signs, including pulse oximetry, reviewed and interpreted by myself.  Vitals:   09/25/20  0215 09/25/20 0230 09/25/20 0245 09/25/20 0300  BP: 133/81 121/82 113/88 126/84  Pulse: 79 80 80 78  Resp: 13 12 11 13   Temp:      TempSrc:      SpO2: 97% 97% 97% 97%  Weight:      Height:       Medications  nitroGLYCERIN (NITROSTAT) SL tablet 0.4 mg (0.4 mg Sublingual Given 09/25/20 0037)  aspirin chewable tablet 324 mg (324 mg Oral Given 09/25/20 0037)  ondansetron (ZOFRAN) injection 4 mg (4 mg Intravenous Given 09/25/20 0041)  pantoprazole (PROTONIX) injection 40 mg (40 mg Intravenous Given 09/25/20 0055)  iohexol (OMNIPAQUE) 350 MG/ML injection 100 mL (100 mLs Intravenous Contrast Given 09/25/20 0122)   1:04 AM Patient became hypotensive after being given a sublingual nitroglycerin tablet.  Systolic blood pressure was down to 75.  Normal saline bolus was initiated.  He stated his pain had abated however.  1:58 AM Patient remains pain-free.  No evidence of aortic dissection or Boerhaave's rupture on CT.  3:34 AM The patient remains pain-free.  His serial troponins are unchanged and well within the normal limits.  His EKG was normal except for sinus tachycardia which resolved with hydration; this was likely due to volume depletion from his profuse vomiting prior to arrival.  I suspect his severe nausea and vomiting caused some acute esophagitis with subsequent esophageal spasm that caused his chest pain.  His nitroglycerin was out of date and likely was ineffective.  The nitroglycerin given  here relieved his pain and nitroglycerin is known to relieve esophageal spasm pain he has had no return of pain and no return of vomiting.  CT is reassuring and that it shows no evidence of a Boerhaave's rupture or aortic dissection.  The patient is comfortable being discharged at this time with instructions to return if symptoms worsen.   PROCEDURES  Procedures CRITICAL CARE Performed by: 09/27/20 Mikhia Dusek Total critical care time: 45 minutes Critical care time was exclusive of separately billable procedures and treating other patients. Critical care was necessary to treat or prevent imminent or life-threatening deterioration. Critical care was time spent personally by me on the following activities: development of treatment plan with patient and/or surrogate as well as nursing, discussions with consultants, evaluation of patient's response to treatment, examination of patient, obtaining history from patient or surrogate, ordering and performing treatments and interventions, ordering and review of laboratory studies, ordering and review of radiographic studies, pulse oximetry and re-evaluation of patient's condition.   ED DIAGNOSES     ICD-10-CM   1. Chest pain with normal EKG  R07.9     2. Nausea and vomiting in adult  R11.2          06-09-1990, MD 09/25/20 [phone]

## 2020-09-25 NOTE — ED Notes (Signed)
On Room Air while laying flat in CT, SpO2 decreased to 86%. Pt placed on 2LNC. SpO2 currently 93-96%

## 2020-09-25 NOTE — ED Triage Notes (Signed)
  Patient comes in with chest pain and SOB that started around 30 mins ago.  Patient states he had several episodes of vomiting that occurred at 2300 and the chest pain came after.  No diaphoresis. Patient endorses pain in the middle of his back but not shoulder/neck/jaw.  Patient took 1 sublingual nitro with some relief.  Patient had prescription for nitro from previous questionable chest pain.  Pain 9/10, pressure in chest.

## 2020-09-25 NOTE — ED Notes (Signed)
Pt became unresponsive. Sternal rub performed.  Pt placed on 100% NRB. Provider notified and to bedside.

## 2020-10-01 ENCOUNTER — Encounter: Payer: Self-pay | Admitting: Neurology

## 2020-10-01 DIAGNOSIS — R519 Headache, unspecified: Secondary | ICD-10-CM | POA: Diagnosis not present

## 2020-10-01 DIAGNOSIS — E118 Type 2 diabetes mellitus with unspecified complications: Secondary | ICD-10-CM | POA: Diagnosis not present

## 2020-10-01 DIAGNOSIS — Z8249 Family history of ischemic heart disease and other diseases of the circulatory system: Secondary | ICD-10-CM | POA: Diagnosis not present

## 2020-10-01 DIAGNOSIS — F4312 Post-traumatic stress disorder, chronic: Secondary | ICD-10-CM | POA: Diagnosis not present

## 2020-10-01 DIAGNOSIS — E785 Hyperlipidemia, unspecified: Secondary | ICD-10-CM | POA: Diagnosis not present

## 2020-10-01 DIAGNOSIS — Z800 Family history of malignant neoplasm of digestive organs: Secondary | ICD-10-CM | POA: Diagnosis not present

## 2020-10-01 DIAGNOSIS — E119 Type 2 diabetes mellitus without complications: Secondary | ICD-10-CM | POA: Diagnosis not present

## 2020-10-13 ENCOUNTER — Encounter: Payer: Self-pay | Admitting: Neurology

## 2020-10-13 ENCOUNTER — Ambulatory Visit: Payer: Self-pay | Admitting: Neurology

## 2020-10-13 DIAGNOSIS — G4733 Obstructive sleep apnea (adult) (pediatric): Secondary | ICD-10-CM | POA: Diagnosis not present

## 2020-10-16 ENCOUNTER — Telehealth: Payer: Self-pay | Admitting: Neurology

## 2020-10-16 ENCOUNTER — Other Ambulatory Visit: Payer: Self-pay | Admitting: *Deleted

## 2020-10-16 NOTE — Telephone Encounter (Signed)
Called pt back. R/s appt for 11/13/20 at 7:45am w/ AL,NP. Advised him to check in by 7:15am and to bring machine w/ him to appt. He is resvent/ibreeze, ID for login: [email or web address].

## 2020-10-16 NOTE — Telephone Encounter (Signed)
Pt called to r/s his initial cpap appt but neither the MD nor the NP has anything available until November. Please advise.

## 2020-11-07 DIAGNOSIS — E785 Hyperlipidemia, unspecified: Secondary | ICD-10-CM | POA: Diagnosis not present

## 2020-11-07 DIAGNOSIS — I2510 Atherosclerotic heart disease of native coronary artery without angina pectoris: Secondary | ICD-10-CM | POA: Diagnosis not present

## 2020-11-07 DIAGNOSIS — R079 Chest pain, unspecified: Secondary | ICD-10-CM | POA: Diagnosis not present

## 2020-11-07 DIAGNOSIS — R011 Cardiac murmur, unspecified: Secondary | ICD-10-CM | POA: Diagnosis not present

## 2020-11-07 DIAGNOSIS — I10 Essential (primary) hypertension: Secondary | ICD-10-CM | POA: Diagnosis not present

## 2020-11-07 DIAGNOSIS — E119 Type 2 diabetes mellitus without complications: Secondary | ICD-10-CM | POA: Diagnosis not present

## 2020-11-07 DIAGNOSIS — R0789 Other chest pain: Secondary | ICD-10-CM | POA: Diagnosis not present

## 2020-11-07 DIAGNOSIS — Z0389 Encounter for observation for other suspected diseases and conditions ruled out: Secondary | ICD-10-CM | POA: Diagnosis not present

## 2020-11-12 DIAGNOSIS — F17210 Nicotine dependence, cigarettes, uncomplicated: Secondary | ICD-10-CM | POA: Diagnosis not present

## 2020-11-12 DIAGNOSIS — Z86010 Personal history of colonic polyps: Secondary | ICD-10-CM | POA: Diagnosis not present

## 2020-11-12 DIAGNOSIS — Z8249 Family history of ischemic heart disease and other diseases of the circulatory system: Secondary | ICD-10-CM | POA: Diagnosis not present

## 2020-11-12 NOTE — Patient Instructions (Addendum)
Please continue using your CPAP regularly. While your insurance requires that you use CPAP at least 4 hours each night on 70% of the nights, I recommend, that you not skip any nights and use it throughout the night if you can. Getting used to CPAP and staying with the treatment long term does take time and patience and discipline. Untreated obstructive sleep apnea when it is moderate to severe can have an adverse impact on cardiovascular health and raise her risk for heart disease, arrhythmias, hypertension, congestive heart failure, stroke and diabetes. Untreated obstructive sleep apnea causes sleep disruption, nonrestorative sleep, and sleep deprivation. This can have an impact on your day to day functioning and cause daytime sleepiness and impairment of cognitive function, memory loss, mood disturbance, and problems focussing. Using CPAP regularly can improve these symptoms.   Please continue working with PCP and psychiatry. Consider daily exercise regimen and healthy diet. Follow up with me in 1 year, sooner if you need me!
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
## 2020-11-13 ENCOUNTER — Ambulatory Visit: Payer: Federal, State, Local not specified - PPO | Admitting: Family Medicine

## 2020-11-13 ENCOUNTER — Other Ambulatory Visit: Payer: Self-pay

## 2020-11-13 ENCOUNTER — Encounter: Payer: Self-pay | Admitting: Family Medicine

## 2020-11-13 VITALS — BP 130/83 | HR 92 | Ht 69.0 in | Wt 233.5 lb

## 2020-11-13 DIAGNOSIS — G4733 Obstructive sleep apnea (adult) (pediatric): Secondary | ICD-10-CM

## 2020-11-13 DIAGNOSIS — Z9989 Dependence on other enabling machines and devices: Secondary | ICD-10-CM | POA: Diagnosis not present

## 2020-11-25 DIAGNOSIS — R079 Chest pain, unspecified: Secondary | ICD-10-CM | POA: Diagnosis not present

## 2020-11-25 DIAGNOSIS — R9439 Abnormal result of other cardiovascular function study: Secondary | ICD-10-CM | POA: Diagnosis not present

## 2020-12-02 DIAGNOSIS — D124 Benign neoplasm of descending colon: Secondary | ICD-10-CM | POA: Diagnosis not present

## 2020-12-02 DIAGNOSIS — K635 Polyp of colon: Secondary | ICD-10-CM | POA: Diagnosis not present

## 2020-12-02 DIAGNOSIS — K621 Rectal polyp: Secondary | ICD-10-CM | POA: Diagnosis not present

## 2020-12-02 DIAGNOSIS — D123 Benign neoplasm of transverse colon: Secondary | ICD-10-CM | POA: Diagnosis not present

## 2020-12-02 DIAGNOSIS — Z1211 Encounter for screening for malignant neoplasm of colon: Secondary | ICD-10-CM | POA: Diagnosis not present

## 2020-12-03 ENCOUNTER — Ambulatory Visit: Payer: Federal, State, Local not specified - PPO | Admitting: Physician Assistant

## 2020-12-12 DIAGNOSIS — Z87891 Personal history of nicotine dependence: Secondary | ICD-10-CM | POA: Diagnosis not present

## 2020-12-12 DIAGNOSIS — R0789 Other chest pain: Secondary | ICD-10-CM | POA: Diagnosis not present

## 2020-12-12 DIAGNOSIS — Z0389 Encounter for observation for other suspected diseases and conditions ruled out: Secondary | ICD-10-CM | POA: Diagnosis not present

## 2021-02-09 DIAGNOSIS — Z23 Encounter for immunization: Secondary | ICD-10-CM | POA: Diagnosis not present

## 2021-02-09 DIAGNOSIS — E119 Type 2 diabetes mellitus without complications: Secondary | ICD-10-CM | POA: Diagnosis not present

## 2021-02-09 DIAGNOSIS — G4733 Obstructive sleep apnea (adult) (pediatric): Secondary | ICD-10-CM | POA: Diagnosis not present

## 2021-02-09 DIAGNOSIS — Z7984 Long term (current) use of oral hypoglycemic drugs: Secondary | ICD-10-CM | POA: Diagnosis not present

## 2021-02-09 DIAGNOSIS — M25511 Pain in right shoulder: Secondary | ICD-10-CM | POA: Diagnosis not present

## 2021-04-06 DIAGNOSIS — E119 Type 2 diabetes mellitus without complications: Secondary | ICD-10-CM | POA: Diagnosis not present

## 2021-04-06 DIAGNOSIS — H524 Presbyopia: Secondary | ICD-10-CM | POA: Diagnosis not present

## 2021-04-21 DIAGNOSIS — H53149 Visual discomfort, unspecified: Secondary | ICD-10-CM | POA: Diagnosis not present

## 2021-04-21 DIAGNOSIS — H524 Presbyopia: Secondary | ICD-10-CM | POA: Diagnosis not present

## 2021-06-08 DIAGNOSIS — G4733 Obstructive sleep apnea (adult) (pediatric): Secondary | ICD-10-CM | POA: Diagnosis not present

## 2021-07-07 DIAGNOSIS — R0789 Other chest pain: Secondary | ICD-10-CM | POA: Diagnosis not present

## 2021-07-27 ENCOUNTER — Encounter: Payer: Self-pay | Admitting: Family Medicine

## 2021-08-04 DIAGNOSIS — E785 Hyperlipidemia, unspecified: Secondary | ICD-10-CM | POA: Diagnosis not present

## 2021-08-04 DIAGNOSIS — I10 Essential (primary) hypertension: Secondary | ICD-10-CM | POA: Diagnosis not present

## 2021-08-04 DIAGNOSIS — E119 Type 2 diabetes mellitus without complications: Secondary | ICD-10-CM | POA: Diagnosis not present

## 2021-08-04 DIAGNOSIS — R519 Headache, unspecified: Secondary | ICD-10-CM | POA: Diagnosis not present

## 2021-08-04 DIAGNOSIS — G4733 Obstructive sleep apnea (adult) (pediatric): Secondary | ICD-10-CM | POA: Diagnosis not present

## 2021-08-12 IMAGING — DX DG CHEST 1V PORT
1 series · 1 of 1 positions shown · non-contrast
Comparison: None.

CLINICAL DATA: Chest pain

EXAM:
PORTABLE CHEST 1 VIEW

[chest]
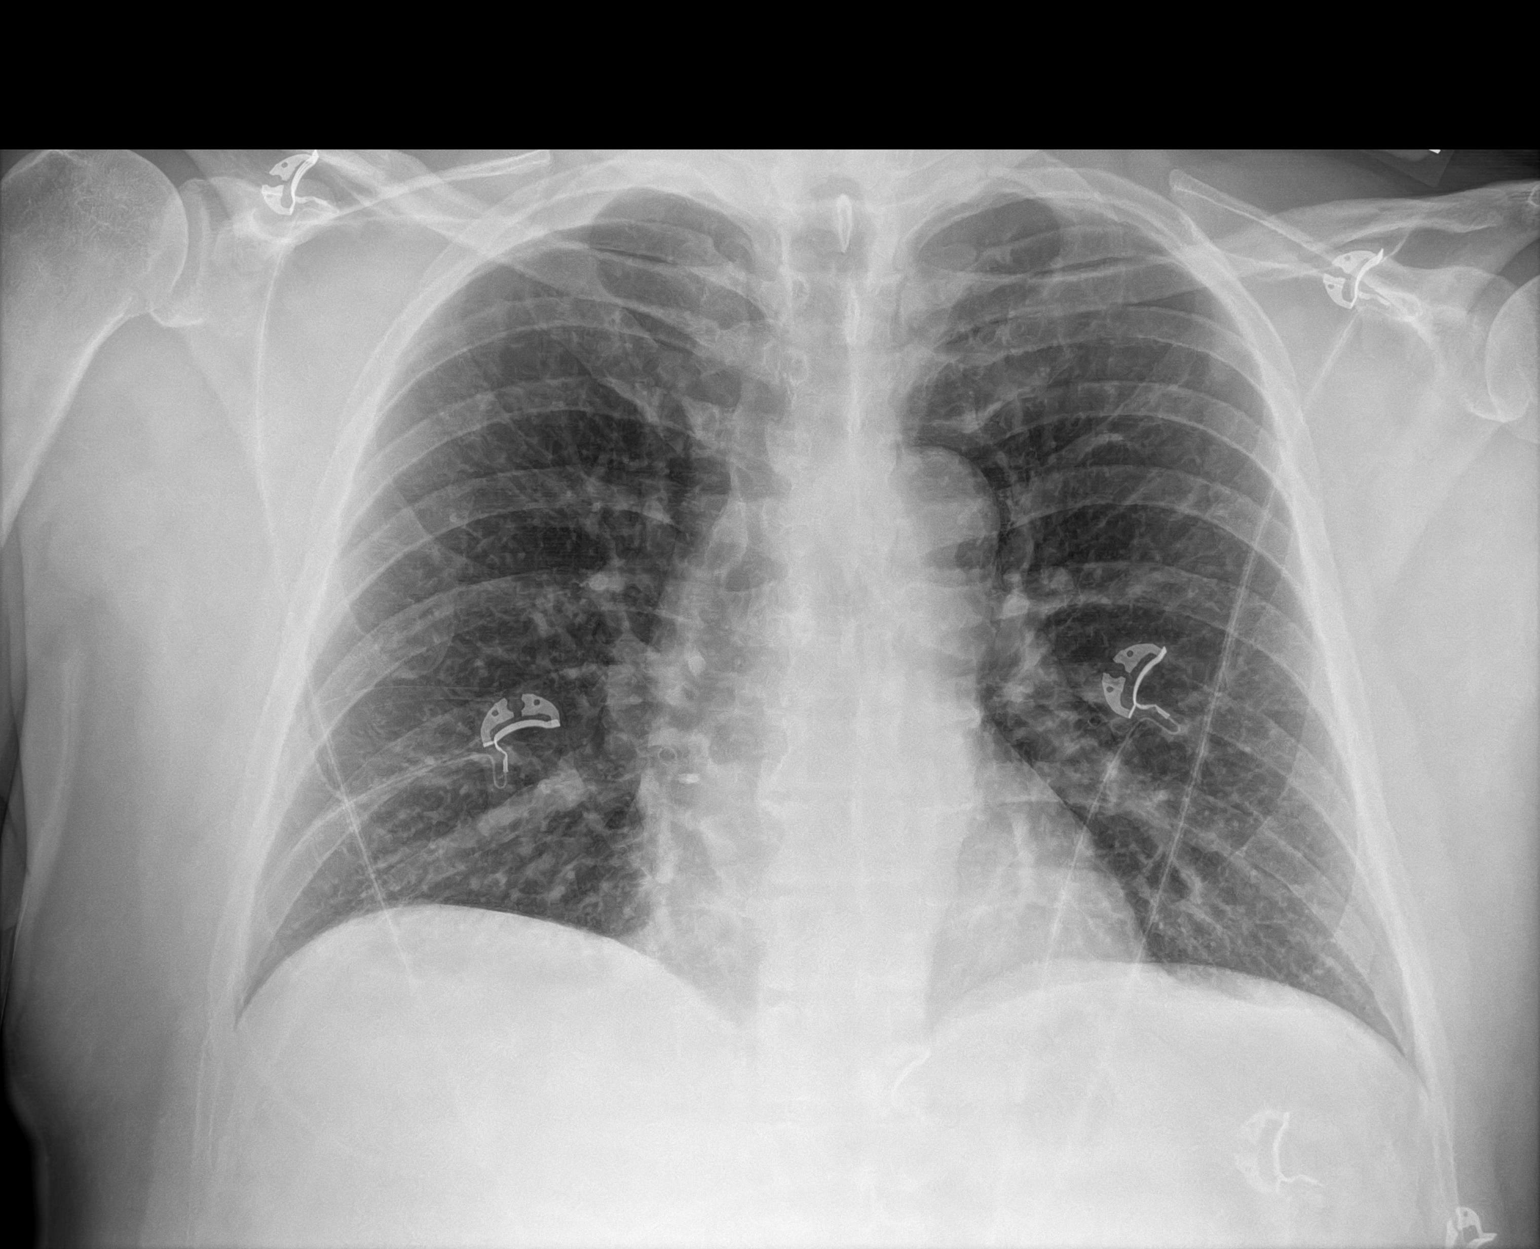

[1 of 1 positions shown; findings below may reference images not displayed]

FINDINGS: Slightly conspicuous lucency seen along the outline of the aortic
arch, could reflect faint pneumomediastinum versus projection of the
mediastinal tissues. Remaining cardiomediastinal contours are
unremarkable. Lungs are otherwise clear. Telemetry leads overlie the
chest. Osseous structures are unremarkable.
IMPRESSION: Crescentic lucency along the aortic arch may be related to
projection of the mediastinal tissues however, cannot exclude
pneumomediastinum in the given clinical setting of shortness of
breath and chest pain with emesis. Correlate with clinical findings
and if there is persisting concern, consider chest CT.

## 2021-08-12 IMAGING — CT CT ANGIO CHEST
2 of 7 series · 16 of 46 positions shown · IV contrast (APPLIED)
Comparison: Radiograph 09/25/2020

CLINICAL DATA: Chest, back pain

EXAM:
CT ANGIOGRAPHY CHEST WITH CONTRAST
TECHNIQUE: Multidetector CT imaging of the chest was performed using the
standard protocol during bolus administration of intravenous
contrast. Multiplanar CT image reconstructions and MIPs were
obtained to evaluate the vascular anatomy.
CONTRAST:  100mL OMNIPAQUE IOHEXOL 350 MG/ML SOLN

[Series 4: arterial · axial · arterial · 0.84mm/px · z∈[-756,-514]mm · 13 of 143 slices shown]
[im 11/143  lung]
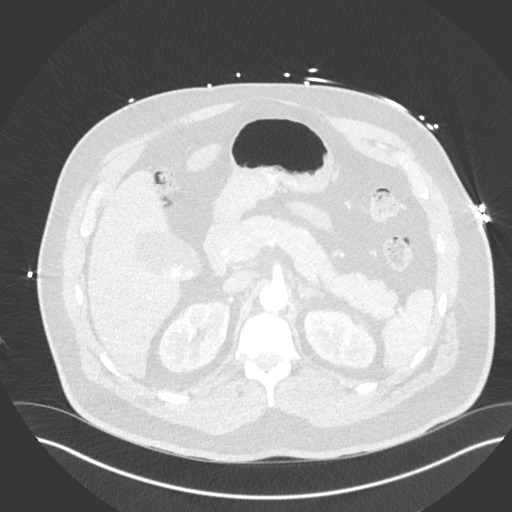
[im 21/143  soft-tissue]
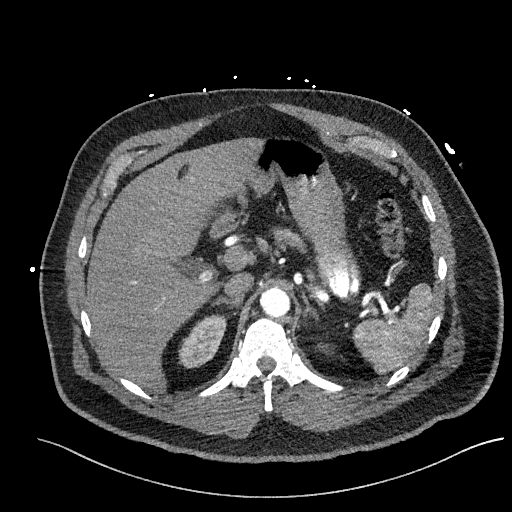
[im 31/143  lung]
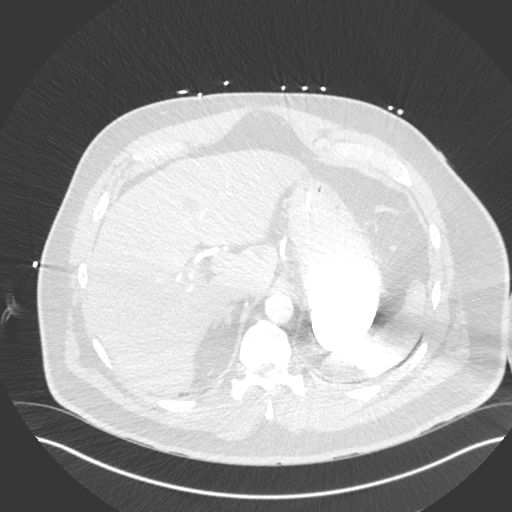
[im 41/143  soft-tissue]
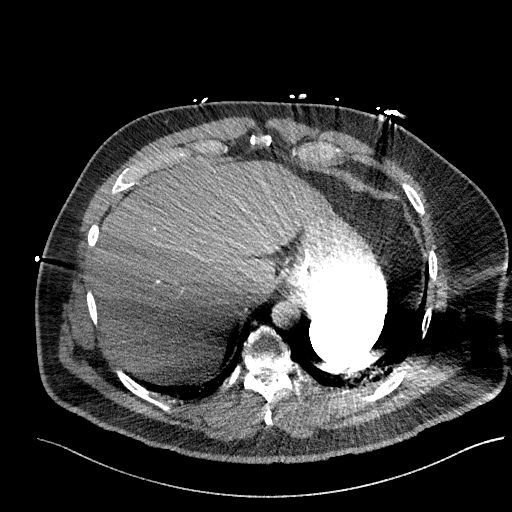
[im 51/143  lung]
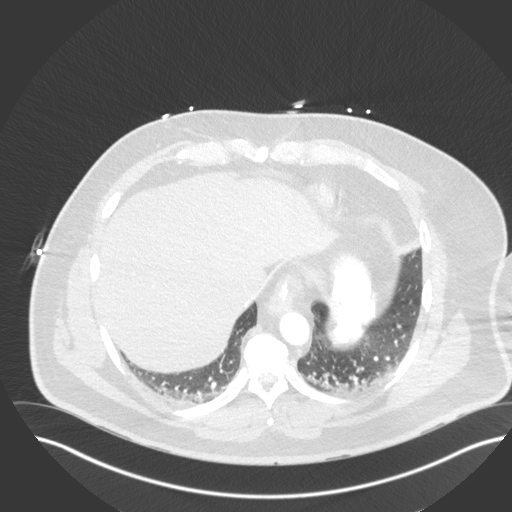
[im 61/143  soft-tissue]
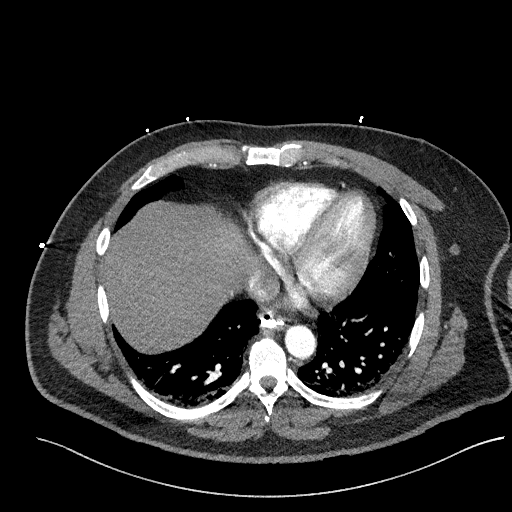
[im 72/143  lung]
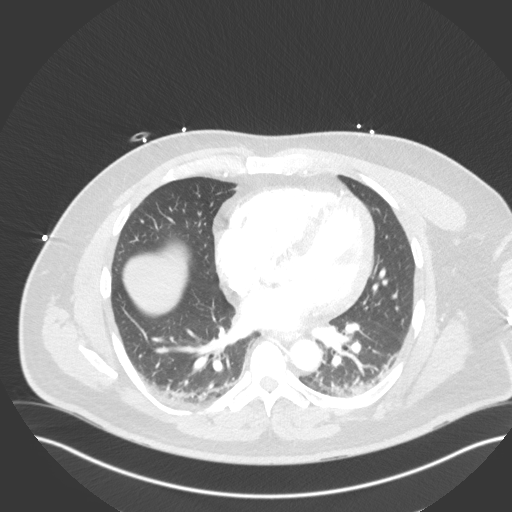
[im 82/143  soft-tissue]
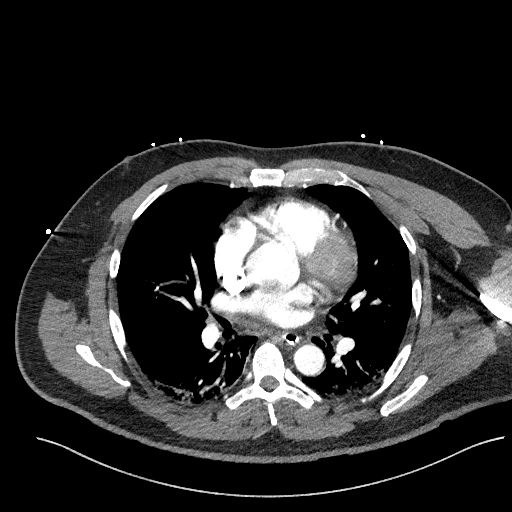
[im 92/143  lung]
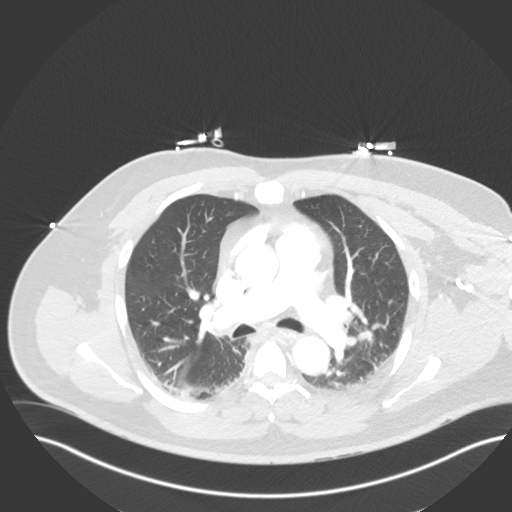
[im 102/143  soft-tissue]
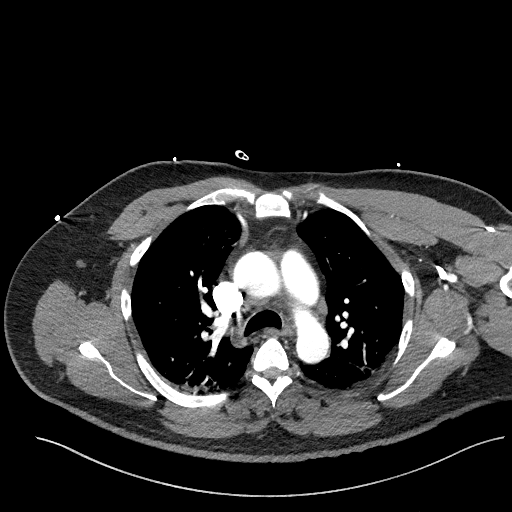
[im 112/143  lung]
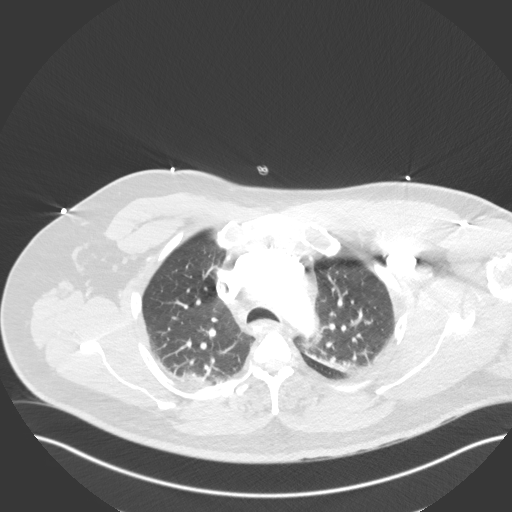
[im 122/143  soft-tissue]
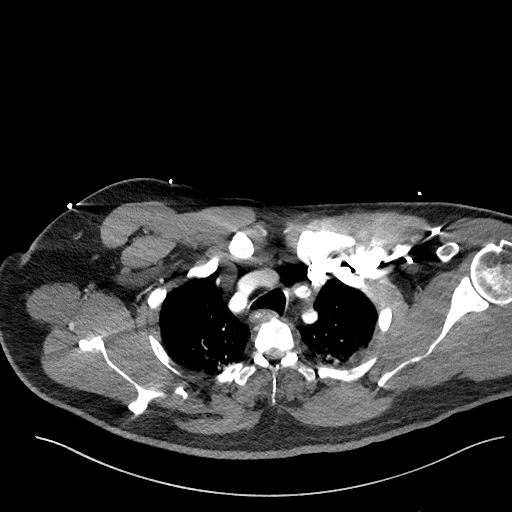
[im 132/143  lung]
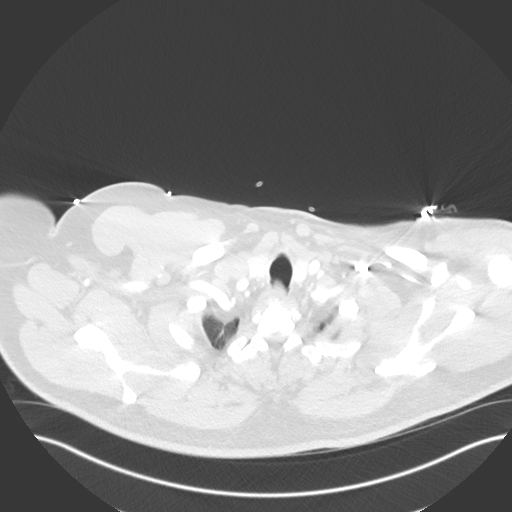

[Series 8: cor soft · coronal · 0.61mm/px · 3 of 140 slices shown]
[im 35/140  soft-tissue]
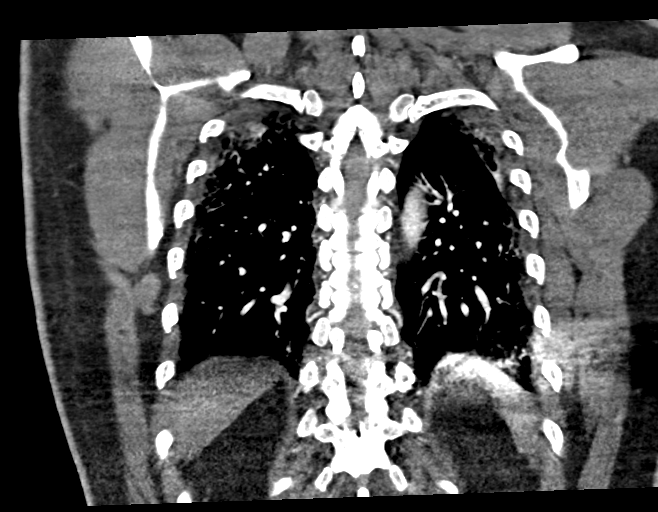
[im 70/140  soft-tissue]
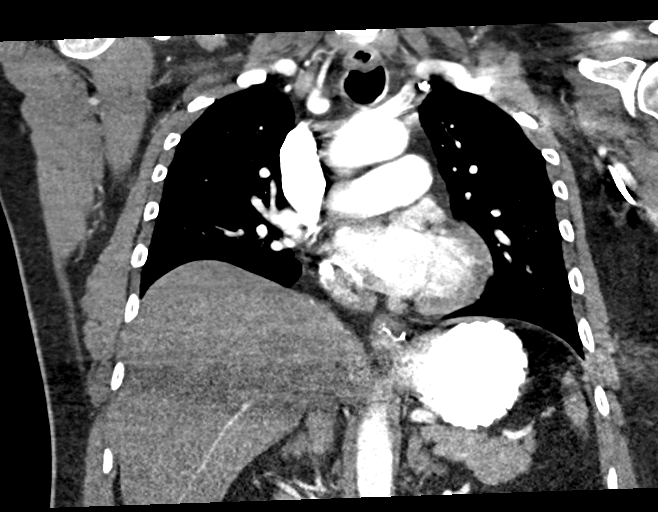
[im 105/140  soft-tissue]
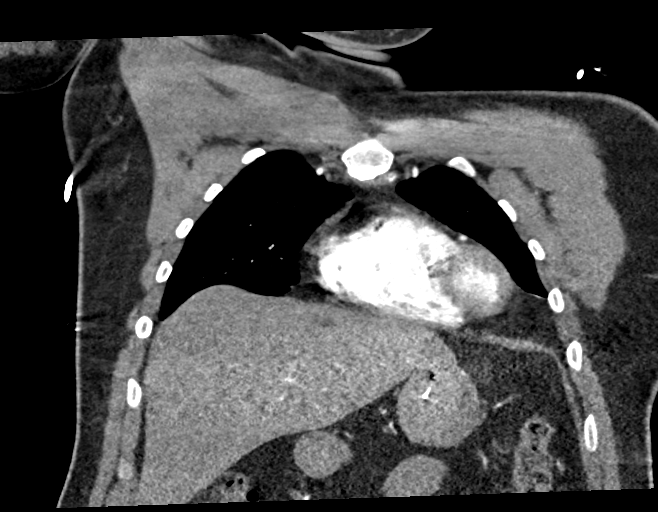

[16 of 46 positions shown; findings below may reference images not displayed]

FINDINGS: Cardiovascular: Noncontrast CT imaging of the chest was performed
initially. No abnormal hyperdense mural thickening is discernible
within the limitations of motion artifact and streak artifact from
high attenuation contrast media within the thoracic esophagus.
Postcontrast administration there is satisfactory opacification of
the arterial vasculature. The aortic root is suboptimally assessed
given cardiac pulsation artifact. The aorta is normal caliber. No
acute luminal abnormality of the imaged aorta. No periaortic
stranding or hemorrhage. Shared origin of the brachiocephalic and
left common carotid arteries. Proximal great vessels are
unremarkable and free of acute abnormality. Radiographic lucency
seen along the aortic arch appears to correspond to mediastinal fat.
Central pulmonary arteries are top-normal caliber. No central or
lobar filling defects are identified on this non tailored
examination of the pulmonary arteries. Normal heart size. No
pericardial effusion.

Mediastinum/Nodes: Enteric contrast media was administered during
the noncontrast portion of this examination. Density of this
contrast results in some streak artifact limiting assessment on the
initial exam but without a discernible sites of contrast
extravasation on the delayed arterial phase of imaging as the
contrast has largely passed to the gastric lumen. No conspicuous
esophageal thickening. No paraesophageal free fluid or gas. Small
hiatal hernia.

No acute abnormality of the trachea. Thyroid gland and thoracic
inlet are unremarkable. No worrisome mediastinal, hilar or axillary
adenopathy.

Lungs/Pleura: Dependent atelectatic changes are present in the
lungs. No confluent airspace disease is seen. Airways are patent. No
pneumothorax or visible layering pleural effusion. No concerning
pulmonary nodules or masses

Upper Abdomen: Multiple calcified gallstones seen within the
otherwise normal gallbladder. No acute abnormalities present in the
visualized portions of the upper abdomen.

Musculoskeletal: No acute osseous abnormality or suspicious osseous
lesion. Minimal degenerative changes in the spine. No worrisome
chest wall mass or lesion.

Review of the MIP images confirms the above findings.
IMPRESSION: 1. No evidence of a concordant dissection or other acute aortic
syndrome.
2. Conspicuous lucency along the aortic arch appears to correspond
to a layer of mediastinal fat extending across the arch. No evidence
of pneumomediastinum.
3. No evidence of pneumomediastinum nor acute tracheo-esophageal
abnormality.
4. Dependent atelectasis without other acute cardiopulmonary
findings.

## 2021-09-14 DIAGNOSIS — G4733 Obstructive sleep apnea (adult) (pediatric): Secondary | ICD-10-CM | POA: Diagnosis not present

## 2021-10-02 DIAGNOSIS — L02216 Cutaneous abscess of umbilicus: Secondary | ICD-10-CM | POA: Diagnosis not present

## 2021-11-12 DIAGNOSIS — G4733 Obstructive sleep apnea (adult) (pediatric): Secondary | ICD-10-CM | POA: Diagnosis not present

## 2021-11-12 NOTE — Patient Instructions (Signed)
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
## 2021-11-17 ENCOUNTER — Telehealth: Payer: Self-pay | Admitting: *Deleted

## 2021-11-17 NOTE — Telephone Encounter (Signed)
Called pt at [phone]. Reminded him to bring CPAP/power cord to appt tomorrow.

## 2021-11-18 ENCOUNTER — Encounter: Payer: Self-pay | Admitting: Family Medicine

## 2021-11-18 ENCOUNTER — Ambulatory Visit: Payer: Federal, State, Local not specified - PPO | Admitting: Family Medicine

## 2021-11-18 VITALS — BP 147/91 | HR 93 | Ht 69.0 in | Wt 238.8 lb

## 2021-11-18 DIAGNOSIS — Z9989 Dependence on other enabling machines and devices: Secondary | ICD-10-CM | POA: Diagnosis not present

## 2021-11-18 DIAGNOSIS — G4733 Obstructive sleep apnea (adult) (pediatric): Secondary | ICD-10-CM

## 2022-01-08 DIAGNOSIS — G4733 Obstructive sleep apnea (adult) (pediatric): Secondary | ICD-10-CM | POA: Diagnosis not present

## 2022-01-25 DIAGNOSIS — Z23 Encounter for immunization: Secondary | ICD-10-CM | POA: Diagnosis not present

## 2022-01-25 DIAGNOSIS — E119 Type 2 diabetes mellitus without complications: Secondary | ICD-10-CM | POA: Diagnosis not present

## 2022-01-25 DIAGNOSIS — Z7984 Long term (current) use of oral hypoglycemic drugs: Secondary | ICD-10-CM | POA: Diagnosis not present

## 2022-04-01 ENCOUNTER — Telehealth: Payer: Self-pay | Admitting: *Deleted

## 2022-04-01 NOTE — Patient Outreach (Signed)
  Care Coordination   04/01/2022 Name: Harold Grimes MRN: [id number] DOB: [birth year]   Care Coordination Outreach Attempts:  An unsuccessful telephone outreach was attempted today to offer the patient information about available care coordination services as a benefit of their health plan.   Follow Up Plan:  Additional outreach attempts will be made to offer the patient care coordination information and services.   Encounter Outcome:  No Answer   Care Coordination Interventions:  No, not indicated    Raina Mina, RN Care Management Coordinator [place] Office [phone]

## 2022-04-02 DIAGNOSIS — Z135 Encounter for screening for eye and ear disorders: Secondary | ICD-10-CM | POA: Diagnosis not present

## 2022-04-02 DIAGNOSIS — G4733 Obstructive sleep apnea (adult) (pediatric): Secondary | ICD-10-CM | POA: Diagnosis not present

## 2022-04-02 DIAGNOSIS — E119 Type 2 diabetes mellitus without complications: Secondary | ICD-10-CM | POA: Diagnosis not present

## 2022-04-06 DIAGNOSIS — Z135 Encounter for screening for eye and ear disorders: Secondary | ICD-10-CM | POA: Diagnosis not present

## 2022-04-06 DIAGNOSIS — H269 Unspecified cataract: Secondary | ICD-10-CM | POA: Diagnosis not present

## 2022-04-06 DIAGNOSIS — H40003 Preglaucoma, unspecified, bilateral: Secondary | ICD-10-CM | POA: Diagnosis not present

## 2022-04-06 DIAGNOSIS — E1136 Type 2 diabetes mellitus with diabetic cataract: Secondary | ICD-10-CM | POA: Diagnosis not present

## 2022-04-26 DIAGNOSIS — Z0189 Encounter for other specified special examinations: Secondary | ICD-10-CM | POA: Diagnosis not present

## 2022-04-26 DIAGNOSIS — K429 Umbilical hernia without obstruction or gangrene: Secondary | ICD-10-CM | POA: Diagnosis not present

## 2022-04-26 DIAGNOSIS — K810 Acute cholecystitis: Secondary | ICD-10-CM | POA: Diagnosis not present

## 2022-05-07 DIAGNOSIS — I2510 Atherosclerotic heart disease of native coronary artery without angina pectoris: Secondary | ICD-10-CM | POA: Diagnosis not present

## 2022-05-07 DIAGNOSIS — E119 Type 2 diabetes mellitus without complications: Secondary | ICD-10-CM | POA: Diagnosis not present

## 2022-05-07 DIAGNOSIS — R519 Headache, unspecified: Secondary | ICD-10-CM | POA: Diagnosis not present

## 2022-05-07 DIAGNOSIS — I10 Essential (primary) hypertension: Secondary | ICD-10-CM | POA: Diagnosis not present

## 2022-05-13 DIAGNOSIS — G4733 Obstructive sleep apnea (adult) (pediatric): Secondary | ICD-10-CM | POA: Diagnosis not present

## 2022-06-26 DIAGNOSIS — S4991XA Unspecified injury of right shoulder and upper arm, initial encounter: Secondary | ICD-10-CM | POA: Diagnosis not present

## 2022-06-26 DIAGNOSIS — M25511 Pain in right shoulder: Secondary | ICD-10-CM | POA: Diagnosis not present

## 2022-06-26 DIAGNOSIS — E7800 Pure hypercholesterolemia, unspecified: Secondary | ICD-10-CM | POA: Diagnosis not present

## 2022-06-26 DIAGNOSIS — Z79899 Other long term (current) drug therapy: Secondary | ICD-10-CM | POA: Diagnosis not present

## 2022-06-26 DIAGNOSIS — Z87891 Personal history of nicotine dependence: Secondary | ICD-10-CM | POA: Diagnosis not present

## 2022-06-26 DIAGNOSIS — S42114A Nondisplaced fracture of body of scapula, right shoulder, initial encounter for closed fracture: Secondary | ICD-10-CM | POA: Diagnosis not present

## 2022-06-26 DIAGNOSIS — E119 Type 2 diabetes mellitus without complications: Secondary | ICD-10-CM | POA: Diagnosis not present

## 2022-06-26 DIAGNOSIS — Z7984 Long term (current) use of oral hypoglycemic drugs: Secondary | ICD-10-CM | POA: Diagnosis not present

## 2022-06-26 DIAGNOSIS — I10 Essential (primary) hypertension: Secondary | ICD-10-CM | POA: Diagnosis not present

## 2022-06-28 DIAGNOSIS — T148XXA Other injury of unspecified body region, initial encounter: Secondary | ICD-10-CM | POA: Diagnosis not present

## 2022-06-28 DIAGNOSIS — S42114A Nondisplaced fracture of body of scapula, right shoulder, initial encounter for closed fracture: Secondary | ICD-10-CM | POA: Diagnosis not present

## 2022-07-07 DIAGNOSIS — R0789 Other chest pain: Secondary | ICD-10-CM | POA: Diagnosis not present

## 2022-07-13 DIAGNOSIS — S42109A Fracture of unspecified part of scapula, unspecified shoulder, initial encounter for closed fracture: Secondary | ICD-10-CM | POA: Insufficient documentation

## 2022-07-13 DIAGNOSIS — G4733 Obstructive sleep apnea (adult) (pediatric): Secondary | ICD-10-CM | POA: Diagnosis not present

## 2022-07-14 DIAGNOSIS — Z7984 Long term (current) use of oral hypoglycemic drugs: Secondary | ICD-10-CM | POA: Diagnosis not present

## 2022-07-14 DIAGNOSIS — S42114D Nondisplaced fracture of body of scapula, right shoulder, subsequent encounter for fracture with routine healing: Secondary | ICD-10-CM | POA: Diagnosis not present

## 2022-07-14 DIAGNOSIS — R519 Headache, unspecified: Secondary | ICD-10-CM | POA: Diagnosis not present

## 2022-07-14 DIAGNOSIS — E119 Type 2 diabetes mellitus without complications: Secondary | ICD-10-CM | POA: Diagnosis not present

## 2022-09-15 DIAGNOSIS — G4733 Obstructive sleep apnea (adult) (pediatric): Secondary | ICD-10-CM | POA: Diagnosis not present

## 2022-11-02 DIAGNOSIS — G4733 Obstructive sleep apnea (adult) (pediatric): Secondary | ICD-10-CM | POA: Diagnosis not present
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
## 2022-11-25 NOTE — Patient Instructions (Signed)

## 2022-11-29 ENCOUNTER — Telehealth: Payer: Self-pay

## 2022-11-29 NOTE — Telephone Encounter (Signed)
Please see Mychart message from today.

## 2022-11-30 ENCOUNTER — Ambulatory Visit: Payer: Federal, State, Local not specified - PPO | Admitting: Family Medicine

## 2022-11-30 ENCOUNTER — Encounter: Payer: Self-pay | Admitting: Family Medicine

## 2022-11-30 VITALS — BP 141/86 | HR 82 | Ht 69.0 in | Wt 243.0 lb

## 2022-11-30 DIAGNOSIS — G4733 Obstructive sleep apnea (adult) (pediatric): Secondary | ICD-10-CM

## 2023-01-28 LAB — HEMOGLOBIN A1C: Hemoglobin A1C: 7.6

## 2023-01-28 NOTE — Progress Notes (Signed)
Declined SDOH.

## 2023-02-07 DIAGNOSIS — G4733 Obstructive sleep apnea (adult) (pediatric): Secondary | ICD-10-CM | POA: Diagnosis not present

## 2023-03-02 ENCOUNTER — Encounter: Payer: Self-pay | Admitting: *Deleted

## 2023-03-02 NOTE — Progress Notes (Signed)
Pt attended 01/28/23 screening event where his b/p was 126/85 and his A1C was 7.6. At the event, the pt confirmed his primary care is at the [place]., that he had [employer], was a former smoker; and he did not identify any SDOH insecurities. Chart review indicates pt's last PCP appt at the [place] was 07/14/22 where his diabetes was addressed and he has numerous [facility] for [facility]/group [facility], hearing loss support, including future appt scheduled through March, 2025. He also has ongoing neurology specialty support for sleep apnea. No additional health equity team support indicated at this time.
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
## 2023-11-28 NOTE — Patient Instructions (Signed)

## 2023-11-29 NOTE — Progress Notes (Unsigned)
 SABRA

## 2023-11-30 ENCOUNTER — Encounter: Payer: Self-pay | Admitting: Family Medicine

## 2023-11-30 ENCOUNTER — Ambulatory Visit: Payer: Federal, State, Local not specified - PPO | Admitting: Family Medicine

## 2023-11-30 VITALS — BP 147/88 | HR 83 | Ht 69.0 in | Wt 235.0 lb

## 2023-11-30 DIAGNOSIS — G4733 Obstructive sleep apnea (adult) (pediatric): Secondary | ICD-10-CM

## 2023-11-30 NOTE — Progress Notes (Signed)
 Community message sent to [employer] that CPAP supplies order placed.

## 2024-12-04 ENCOUNTER — Ambulatory Visit: Admitting: Family Medicine
